# Patient Record
Sex: Male | Born: 1955 | Race: White | Hispanic: No | Marital: Single | Smoking: Current every day smoker
Health system: Southern US, Community
[De-identification: ages and names within clinical notes are randomized; demographics above are authoritative.]

## PROBLEM LIST (undated history)

## (undated) DIAGNOSIS — M79671 Pain in right foot: Secondary | ICD-10-CM

## (undated) DIAGNOSIS — Z9181 History of falling: Secondary | ICD-10-CM

## (undated) DIAGNOSIS — I639 Cerebral infarction, unspecified: Secondary | ICD-10-CM

## (undated) DIAGNOSIS — M545 Low back pain, unspecified: Secondary | ICD-10-CM

## (undated) DIAGNOSIS — I10 Essential (primary) hypertension: Secondary | ICD-10-CM

## (undated) DIAGNOSIS — M1990 Unspecified osteoarthritis, unspecified site: Secondary | ICD-10-CM

## (undated) DIAGNOSIS — M79672 Pain in left foot: Secondary | ICD-10-CM

## (undated) HISTORY — DX: History of falling: Z91.81

## (undated) HISTORY — DX: Pain in left foot: M79.672

## (undated) HISTORY — DX: Pain in right foot: M79.671

---

## 2015-09-16 ENCOUNTER — Emergency Department (HOSPITAL_COMMUNITY)
Admission: EM | Admit: 2015-09-16 | Discharge: 2015-09-16 | Disposition: A | Discharge status: Home / Self Care | Payer: Medicaid Other | Attending: Emergency Medicine | Admitting: Emergency Medicine

## 2015-09-16 ENCOUNTER — Emergency Department (HOSPITAL_COMMUNITY): Payer: Medicaid Other

## 2015-09-16 ENCOUNTER — Encounter (HOSPITAL_COMMUNITY): Payer: Self-pay | Admitting: Emergency Medicine

## 2015-09-16 DIAGNOSIS — M1990 Unspecified osteoarthritis, unspecified site: Secondary | ICD-10-CM | POA: Insufficient documentation

## 2015-09-16 DIAGNOSIS — Y9389 Activity, other specified: Secondary | ICD-10-CM | POA: Insufficient documentation

## 2015-09-16 DIAGNOSIS — S80212A Abrasion, left knee, initial encounter: Secondary | ICD-10-CM | POA: Insufficient documentation

## 2015-09-16 DIAGNOSIS — T07XXXA Unspecified multiple injuries, initial encounter: Secondary | ICD-10-CM

## 2015-09-16 DIAGNOSIS — Y999 Unspecified external cause status: Secondary | ICD-10-CM | POA: Insufficient documentation

## 2015-09-16 DIAGNOSIS — S50312A Abrasion of left elbow, initial encounter: Secondary | ICD-10-CM | POA: Diagnosis not present

## 2015-09-16 DIAGNOSIS — I10 Essential (primary) hypertension: Secondary | ICD-10-CM | POA: Diagnosis not present

## 2015-09-16 DIAGNOSIS — Z79899 Other long term (current) drug therapy: Secondary | ICD-10-CM | POA: Diagnosis not present

## 2015-09-16 DIAGNOSIS — Y92410 Unspecified street and highway as the place of occurrence of the external cause: Secondary | ICD-10-CM | POA: Insufficient documentation

## 2015-09-16 DIAGNOSIS — S59902A Unspecified injury of left elbow, initial encounter: Secondary | ICD-10-CM | POA: Diagnosis present

## 2015-09-16 DIAGNOSIS — M25422 Effusion, left elbow: Secondary | ICD-10-CM | POA: Diagnosis not present

## 2015-09-16 DIAGNOSIS — F17210 Nicotine dependence, cigarettes, uncomplicated: Secondary | ICD-10-CM | POA: Diagnosis not present

## 2015-09-16 DIAGNOSIS — Z8673 Personal history of transient ischemic attack (TIA), and cerebral infarction without residual deficits: Secondary | ICD-10-CM | POA: Insufficient documentation

## 2015-09-16 HISTORY — DX: Unspecified osteoarthritis, unspecified site: M19.90

## 2015-09-16 HISTORY — DX: Low back pain, unspecified: M54.50

## 2015-09-16 HISTORY — DX: Cerebral infarction, unspecified: I63.9

## 2015-09-16 HISTORY — DX: Low back pain: M54.5

## 2015-09-16 HISTORY — DX: Essential (primary) hypertension: I10

## 2015-09-16 LAB — CBG MONITORING, ED: Glucose-Capillary: 99 mg/dL (ref 65–99)
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
## 2015-09-16 MED ORDER — HYDROCODONE-ACETAMINOPHEN 5-325 MG PO TABS: 2.0000 | ORAL_TABLET | ORAL | Status: DC | PRN

## 2015-09-16 MED ORDER — IBUPROFEN 800 MG PO TABS
800.0000 mg | ORAL_TABLET | Freq: Once | ORAL | Status: AC
  Administered 2015-09-16: 800 mg via ORAL
  Filled 2015-09-16: qty 1
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
## 2015-09-16 NOTE — ED Notes (Signed)
MD at bedside. 

## 2015-09-16 NOTE — Discharge Instructions (Signed)
Call orthopedic doctor in 7-10 days with any persistent elbow pain.  Call to the [facility] for appointment for all of your continued ongoing health needs
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
## 2015-12-06 ENCOUNTER — Ambulatory Visit (INDEPENDENT_AMBULATORY_CARE_PROVIDER_SITE_OTHER): Payer: Medicaid Other | Admitting: Internal Medicine

## 2015-12-06 ENCOUNTER — Encounter: Payer: Self-pay | Admitting: Internal Medicine

## 2015-12-06 VITALS — BP 130/68 | HR 62 | Temp 97.9°F | Ht 76.0 in | Wt 217.4 lb

## 2015-12-06 DIAGNOSIS — M79671 Pain in right foot: Secondary | ICD-10-CM

## 2015-12-06 DIAGNOSIS — G8929 Other chronic pain: Secondary | ICD-10-CM

## 2015-12-06 DIAGNOSIS — M79672 Pain in left foot: Secondary | ICD-10-CM

## 2015-12-06 DIAGNOSIS — Z8679 Personal history of other diseases of the circulatory system: Secondary | ICD-10-CM | POA: Diagnosis not present

## 2015-12-06 DIAGNOSIS — M545 Low back pain: Secondary | ICD-10-CM | POA: Diagnosis not present

## 2015-12-06 DIAGNOSIS — R7303 Prediabetes: Secondary | ICD-10-CM | POA: Insufficient documentation

## 2015-12-06 DIAGNOSIS — E785 Hyperlipidemia, unspecified: Secondary | ICD-10-CM

## 2015-12-06 DIAGNOSIS — E119 Type 2 diabetes mellitus without complications: Secondary | ICD-10-CM

## 2015-12-06 HISTORY — DX: Pain in right foot: M79.671

## 2015-12-06 LAB — BASIC METABOLIC PANEL WITH GFR
BUN: 13 mg/dL (ref 7–25)
CHLORIDE: 111 mmol/L — AB (ref 98–110)
CO2: 22 mmol/L (ref 20–31)
Calcium: 9.3 mg/dL (ref 8.6–10.3)
Creat: 0.77 mg/dL (ref 0.70–1.33)
GFR, Est African American: >89 mL/min (ref >=60)
Glucose, Bld: 71 mg/dL (ref 65–99)
POTASSIUM: 4.7 mmol/L (ref 3.5–5.3)
SODIUM: 139 mmol/L (ref 135–146)

## 2015-12-06 LAB — POCT GLYCOSYLATED HEMOGLOBIN (HGB A1C): Hemoglobin A1C: 6

## 2015-12-06 LAB — LIPID PANEL
CHOLESTEROL: 171 mg/dL (ref 125–200)
HDL: 36 mg/dL — ABNORMAL LOW (ref >=40)
LDL Cholesterol: 117 mg/dL (ref <130)
TRIGLYCERIDES: 90 mg/dL (ref <150)
Total CHOL/HDL Ratio: 4.8 Ratio (ref <=5.0)
VLDL: 18 mg/dL (ref <30)

## 2015-12-06 MED ORDER — MELOXICAM 7.5 MG PO TABS: 7.5000 mg | ORAL_TABLET | Freq: Every day | ORAL | 2 refills | Status: DC

## 2015-12-06 NOTE — Patient Instructions (Signed)
It was nice meeting you today [name].   We will call you if your bloodwork is abnormal and we need to begin any medications.   For your back pain, you can take Mobic (meloxicam) every morning. It is also important to do the strengthening and stretching exercises provided in the handout at least twice a day. Staying active will also help with your pain.   For your foot pain, please continue to wear comfortable shoes with good support. Mobic should also help with your foot pain. If your pain continues to worsen, we can discuss referral to a podiatrist.   If you have any questions or concerns, please feel free to call the clinic.   Be well,  Dr. Natale Mil[place]   Back Pain, Adult Back pain is very common. The pain often gets better over time. The cause of back pain is usually not dangerous. Most people can learn to manage their back pain on their own.  HOME CARE  Watch your back pain for any changes. The following actions may help to lessen any pain you are feeling:  Stay active. Start with short walks on flat ground if you can. Try to walk farther each day.  Exercise regularly as told by your doctor. Exercise helps your back heal faster. It also helps avoid future injury by keeping your muscles strong and flexible.  Do not sit, drive, or stand in one place for more than 30 minutes.  Do not stay in bed. Resting more than 1-2 days can slow down your recovery.  Be careful when you bend or lift an object. Use good form when lifting:  Bend at your knees.  Keep the object close to your body.  Do not twist.  Sleep on a firm mattress. Lie on your side, and bend your knees. If you lie on your back, put a pillow under your knees.  Take medicines only as told by your doctor.  Put ice on the injured area.  Put ice in a plastic bag.  Place a towel between your skin and the bag.  Leave the ice on for 20 minutes, 2-3 times a day for the first 2-3 days. After that, you can switch between ice  and heat packs.  Avoid feeling anxious or stressed. Find good ways to deal with stress, such as exercise.  Maintain a healthy weight. Extra weight puts stress on your back. GET HELP IF:   You have pain that does not go away with rest or medicine.  You have worsening pain that goes down into your legs or buttocks.  You have pain that does not get better in one week.  You have pain at night.  You lose weight.  You have a fever or chills. GET HELP RIGHT AWAY IF:   You cannot control when you poop (bowel movement) or pee (urinate).  Your arms or legs feel weak.  Your arms or legs lose feeling (numbness).  You feel sick to your stomach (nauseous) or throw up (vomit).  You have belly (abdominal) pain.  You feel like you may pass out (faint).   This information is not intended to replace advice given to you by your health care provider. Make sure you discuss any questions you have with your health care provider.   Document Released: 10/03/2007 Document Revised: 05/07/2014 Document Reviewed: 08/18/2013 Elsevier Interactive Patient Education 2016 Else[employer] Inc[employer]   Back Exercises If you have pain in your back, do these exercises 2-3 times each day or as told  by your doctor. When the pain goes away, do the exercises once each day, but repeat the steps more times for each exercise (do more repetitions). If you do not have pain in your back, do these exercises once each day or as told by your doctor. EXERCISES Single Knee to Chest Do these steps 3-5 times in a row for each leg: 1. Lie on your back on a firm bed or the floor with your legs stretched out. 2. Bring one knee to your chest. 3. Hold your knee to your chest by grabbing your knee or thigh. 4. Pull on your knee until you feel a gentle stretch in your lower back. 5. Keep doing the stretch for 10-30 seconds. 6. Slowly let go of your leg and straighten it. Pelvic Tilt Do these steps 5-10 times in a row: 1. Lie on your back  on a firm bed or the floor with your legs stretched out. 2. Bend your knees so they point up to the ceiling. Your feet should be flat on the floor. 3. Tighten your lower belly (abdomen) muscles to press your lower back against the floor. This will make your tailbone point up to the ceiling instead of pointing down to your feet or the floor. 4. Stay in this position for 5-10 seconds while you gently tighten your muscles and breathe evenly. Cat-Cow Do these steps until your lower back bends more easily: 1. Get on your hands and knees on a firm surface. Keep your hands under your shoulders, and keep your knees under your hips. You may put padding under your knees. 2. Let your head hang down, and make your tailbone point down to the floor so your lower back is round like the back of a cat. 3. Stay in this position for 5 seconds. 4. Slowly lift your head and make your tailbone point up to the ceiling so your back hangs low (sags) like the back of a cow. 5. Stay in this position for 5 seconds. Press-Ups Do these steps 5-10 times in a row: 1. Lie on your belly (face-down) on the floor. 2. Place your hands near your head, about shoulder-width apart. 3. While you keep your back relaxed and keep your hips on the floor, slowly straighten your arms to raise the top half of your body and lift your shoulders. Do not use your back muscles. To make yourself more comfortable, you may change where you place your hands. 4. Stay in this position for 5 seconds. 5. Slowly return to lying flat on the floor. Bridges Do these steps 10 times in a row: 1. Lie on your back on a firm surface. 2. Bend your knees so they point up to the ceiling. Your feet should be flat on the floor. 3. Tighten your butt muscles and lift your butt off of the floor until your waist is almost as high as your knees. If you do not feel the muscles working in your butt and the back of your thighs, slide your feet 1-2 inches farther away from your  butt. 4. Stay in this position for 3-5 seconds. 5. Slowly lower your butt to the floor, and let your butt muscles relax. If this exercise is too easy, try doing it with your arms crossed over your chest. Belly Crunches Do these steps 5-10 times in a row: 1. Lie on your back on a firm bed or the floor with your legs stretched out. 2. Bend your knees so they point up to the  ceiling. Your feet should be flat on the floor. 3. Cross your arms over your chest. 4. Tip your chin a little bit toward your chest but do not bend your neck. 5. Tighten your belly muscles and slowly raise your chest just enough to lift your shoulder blades a tiny bit off of the floor. 6. Slowly lower your chest and your head to the floor. Back Lifts Do these steps 5-10 times in a row: 1. Lie on your belly (face-down) with your arms at your sides, and rest your forehead on the floor. 2. Tighten the muscles in your legs and your butt. 3. Slowly lift your chest off of the floor while you keep your hips on the floor. Keep the back of your head in line with the curve in your back. Look at the floor while you do this. 4. Stay in this position for 3-5 seconds. 5. Slowly lower your chest and your face to the floor. GET HELP IF:  Your back pain gets a lot worse when you do an exercise.  Your back pain does not lessen 2 hours after you exercise. If you have any of these problems, stop doing the exercises. Do not do them again unless your doctor says it is okay. GET HELP RIGHT AWAY IF:  You have sudden, very bad back pain. If this happens, stop doing the exercises. Do not do them again unless your doctor says it is okay.   This information is not intended to replace advice given to you by your health care provider. Make sure you discuss any questions you have with your health care provider.   Document Released: 05/19/2010 Document Revised: 01/05/2015 Document Reviewed: 06/10/2014 Elsevier Interactive Patient Education [employer].

## 2015-12-06 NOTE — Assessment & Plan Note (Signed)
Reportedly diagnosed at prior PCP's office. Not currently taking medications, though says he was in the past but cannot remember which ones. BP 130/68 today.  - Given current BP, will not begin medication at this time.  - Continue to monitor

## 2015-12-06 NOTE — Progress Notes (Signed)
60 y.o. year old male presents to establish care.  Acute Concerns:  Heel pain Pain in both heels intermittently since March. Reports that it feels as if he is walking on stones. Also endorses sharp pain on the lateral portion of feet. Has tried insoles with no relief. Does have improvement of symptom with memory foam tennis shoes. Pain is worse at end of day. Has taken ibuprofen for pain with minimal improvement. Does have difficulty walking at times due to pain. Denies numbness, weakness, tingling.   Lower back pain For the past 15 years. Formerly worked at a job that required frequent heavy lifting. Has never had back surgery, and does not desire this. Worse first thing in AM and late in the evening. Reports he has been told that he has arthritis and has 5 discs out of line. Endorses midline pain that shoots up his back into his shoulder and neck. Ibuprofen does not provide relief. Was reportedly taking the highest dose of Tylenol with codeine in [place] which did provide relief. Denies bowel or bladder incontinence, saddle anesthesia.   Social: Moved from [place] to [place] five months ago. Has not established care with a provider until today. Was formerly married until his wife passed away two years ago. Reports history of hyperlipidemia, Type II DM, "minor strokes," HTN, MI.   Exercise: Walks daily  Sexual History: Not sexually active for past two years after his wife died.   Social:  Social History   Social History  . Marital status: Single    Spouse name: N/A  . Number of children: N/A  . Years of education: N/A   Social History Main Topics  . Smoking status: Current Every Day Smoker    Packs/day: 1.00    Years: 41.00    Types: Cigarettes  . Smokeless tobacco: Never Used  . Alcohol use No  . Drug use:     Types: Marijuana     Comment: About once monthly when in severe pain or needs to relax  . Sexual activity: Not Currently   Other Topics Concern  . None    Social History Narrative  . None    Physical Exam: VITALS: Reviewed GEN: Pleasant male, NAD HEENT: Normocephalic, PERRL, EOMI, no scleral icterus, bilateral TM pearly grey, nasal septum midline, MMM, uvula midline, no anterior or posterior lymphadenopathy, no thyromegaly; edentulous  CARDIAC:RRR, S1 and S2 present, no murmur, no heaves/thrills RESP: CTAB, normal effort ABD: soft, no tenderness, normal bowel sounds EXT: No edema, no tenderness; normal gait  ASSESSMENT & PLAN: 60 y.o. male presents for annual well male/preventative exam. Please see problem specific assessment and plan.   Hyperlipidemia Not currently on medication and cannot remember what he has taken in the past.  - Lipid panel today - Will call patient if there are abnormalities and indication to resume medication  History of hypertension Reportedly diagnosed at prior PCP's office. Not currently taking medications, though says he was in the past but cannot remember which ones. BP 130/68 today.  - Given current BP, will not begin medication at this time.  - Continue to monitor  Type 2 diabetes mellitus (HCC) Diagnosed by former PCP. Was taking metformin 1000mg  qd, but has not taken for past two months. Does not check blood sugar at home.  - A1C today - Will call patient with results if need to resume medication  Chronic lower back pain Currently taking ibuprofen. Not interfering with daily activities. No red flags.  - Meloxicam 7.5mg  qd. Can increase  to  if ineffective - Provided handouts with strengthening and stretching exercises and instructions to perform exercises at least twice a day - F/u at next appointment   Heel pain, bilateral Unknown etiology. Not interfering with daily activities and improved when wearing memory foam tennis shoes.  - Meloxicam 7.5mg  qd - Continue to wear memory foam shoes or orthotic insoles if provides pain relief - Can consider podiatry referral if no improvement

## 2015-12-06 NOTE — Assessment & Plan Note (Signed)
Unknown etiology. Not interfering with daily activities and improved when wearing memory foam tennis shoes.  - Meloxicam 7.5mg  qd - Continue to wear memory foam shoes or orthotic insoles if provides pain relief - Can consider podiatry referral if no improvement
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
## 2015-12-06 NOTE — Assessment & Plan Note (Signed)
Not currently on medication and cannot remember what he has taken in the past.  - Lipid panel today - Will call patient if there are abnormalities and indication to resume medication

## 2015-12-06 NOTE — Assessment & Plan Note (Signed)
Currently taking ibuprofen. Not interfering with daily activities. No red flags.  - Meloxicam 7.5mg  qd. Can increase to  if ineffective - Provided handouts with strengthening and stretching exercises and instructions to perform exercises at least twice a day - F/u at next appointment

## 2015-12-12 ENCOUNTER — Telehealth: Payer: Self-pay | Admitting: Internal Medicine

## 2015-12-12 NOTE — Telephone Encounter (Signed)
Pt is calling for his lab results. jw  °

## 2015-12-14 NOTE — Telephone Encounter (Signed)
Patient informed. 

## 2016-06-13 ENCOUNTER — Ambulatory Visit: Payer: Medicaid Other | Admitting: Family Medicine

## 2016-08-06 ENCOUNTER — Inpatient Hospital Stay
Admission: AD | Admit: 2016-08-06 | Payer: No Typology Code available for payment source | Source: Intra-hospital | Admitting: Psychiatry

## 2017-09-04 ENCOUNTER — Ambulatory Visit: Payer: Self-pay

## 2017-11-27 ENCOUNTER — Ambulatory Visit: Payer: Medicaid Other

## 2017-11-29 ENCOUNTER — Ambulatory Visit: Payer: Medicaid Other | Admitting: Family Medicine

## 2017-11-29 ENCOUNTER — Encounter: Payer: Self-pay | Admitting: Family Medicine

## 2017-11-29 ENCOUNTER — Other Ambulatory Visit: Payer: Self-pay

## 2017-11-29 VITALS — BP 124/64 | HR 64 | Temp 98.7°F | Ht 76.0 in | Wt 217.8 lb

## 2017-11-29 DIAGNOSIS — G8929 Other chronic pain: Secondary | ICD-10-CM

## 2017-11-29 DIAGNOSIS — R7303 Prediabetes: Secondary | ICD-10-CM

## 2017-11-29 DIAGNOSIS — M545 Low back pain: Secondary | ICD-10-CM

## 2017-11-29 DIAGNOSIS — E119 Type 2 diabetes mellitus without complications: Secondary | ICD-10-CM

## 2017-11-29 DIAGNOSIS — Z0000 Encounter for general adult medical examination without abnormal findings: Secondary | ICD-10-CM

## 2017-11-29 LAB — POCT GLYCOSYLATED HEMOGLOBIN (HGB A1C): HbA1c, POC (controlled diabetic range): 5.5 % (ref 0.0–7.0)

## 2017-11-29 MED ORDER — NITROGLYCERIN 0.4 MG SL SUBL: 0.4000 mg | SUBLINGUAL_TABLET | SUBLINGUAL | 3 refills | Status: DC | PRN

## 2017-11-29 MED ORDER — MELOXICAM 7.5 MG PO TABS: 7.5000 mg | ORAL_TABLET | Freq: Every day | ORAL | 2 refills | Status: DC

## 2017-11-29 MED ORDER — DICLOFENAC SODIUM 1 % TD GEL: 4.0000 g | Freq: Four times a day (QID) | TRANSDERMAL | 0 refills | Status: DC

## 2017-11-29 NOTE — Patient Instructions (Addendum)
It was great seeing you today! Congrats, your A1C has improved today. It looks like you are mostly up to date on your health maintenance items. I will work on getting you an Tree surgeonelectric chair. I have sent in prescriptions to your pharmacy for nitro, voltaren gel, and meloxicam. I have put in an order for the cologuard. I will also work on getting you an Art gallery managerelectric scooter.

## 2017-12-05 DIAGNOSIS — Z0000 Encounter for general adult medical examination without abnormal findings: Secondary | ICD-10-CM | POA: Insufficient documentation

## 2017-12-05 NOTE — Assessment & Plan Note (Signed)
Patient interested in colonscopy. He [place] and [place] [facility] [place]

## 2017-12-05 NOTE — Assessment & Plan Note (Signed)
a1c 5.5 placing him pre-diabetes. Doing well with no issues on this standpoint. Refused all diabetes screening measures as he "doesn't have diabetes"

## 2017-12-05 NOTE — Assessment & Plan Note (Signed)
Refilled meloxicam. Started voltaren gel. Has been stable for years. Interested in the voltaren.

## 2017-12-05 NOTE — Progress Notes (Signed)
   HPI 62 year old who presents for health check up. Patient's a1c 5.5 from 6.0. He is interested in an electric chair to scoot around in. Patient without significant gait abnormality.   Discussed health maintenance items such as colonoscopy. Patient claims to have gotten hep c, pneumovax, hiv, [place]ap in [place]. Asked him to try and get those records to us.  Patient also asking for refills on nitro and meloxicam.   CC: check up   ROS:   Review of Systems See HPI for ROS.   CC, SH/smoking status, and VS noted  Objective: BP 124/64   Pulse 64   Temp 98.7 F (37.1 C) (Oral)   Ht 6\' 4"  (1.93 m)   Wt 217 lb 12.8 oz (98.8 kg)   SpO2 96%   BMI 26.51 kg/m  Gen: NAD, alert, cooperative, and pleasant. Middle aged caucasian male no acute distress HEENT: NCAT, EOMI, PERRL CV: RRR, no murmur Resp: CTAB, no wheezes, non-labored Abd: SNTND, BS present, no guarding or organomegaly Ext: No edema, warm Neuro: Alert and oriented, Speech clear, No gross deficits   Assessment and plan:  Pre-diabetes a1c 5.5 placing him pre-diabetes. Doing well with no issues on this standpoint. Refused all diabetes screening measures as he "doesn't have diabetes"  Health care maintenance Patient interested in colonscopy. He will try and get records sent to [facility] [place].  Chronic lower back pain Refilled meloxicam. Started voltaren gel. Has been stable for years. Interested in the voltaren.   Orders Placed This Encounter  Procedures  . POCT glycosylated hemoglobin (Hb A1C)    Meds ordered this encounter  Medications  . meloxicam (MOBIC) 7.5 MG tablet    Sig: Take 1 tablet (7.5 mg total) by mouth daily.    Dispense:  30 tablet    Refill:  2  . diclofenac sodium (VOLTAREN) 1 % GEL    Sig: Apply 4 g topically 4 (four) times daily.    Dispense:  100 g    Refill:  0  . nitroGLYCERIN (NITROSTAT) 0.4 MG SL tablet    Sig: Place 1 tablet (0.4 mg total) under the tongue every 5 (five)  minutes as needed for chest pain.    Dispense:  50 tablet    Refill:  3     Myrene BuddyJacob Sevilla Murtagh MD PGY-2 Family Medicine Resident  12/05/2017 6:46 PM

## 2018-01-27 ENCOUNTER — Ambulatory Visit (INDEPENDENT_AMBULATORY_CARE_PROVIDER_SITE_OTHER): Payer: Medicaid Other | Admitting: Family Medicine

## 2018-01-27 ENCOUNTER — Other Ambulatory Visit: Payer: Self-pay

## 2018-01-27 ENCOUNTER — Encounter: Payer: Self-pay | Admitting: Family Medicine

## 2018-01-27 VITALS — BP 128/72 | HR 61 | Temp 97.5°F | Wt 218.0 lb

## 2018-01-27 DIAGNOSIS — Z8679 Personal history of other diseases of the circulatory system: Secondary | ICD-10-CM

## 2018-01-27 DIAGNOSIS — R0602 Shortness of breath: Secondary | ICD-10-CM

## 2018-01-27 DIAGNOSIS — Z23 Encounter for immunization: Secondary | ICD-10-CM | POA: Diagnosis not present

## 2018-01-27 DIAGNOSIS — G8929 Other chronic pain: Secondary | ICD-10-CM | POA: Diagnosis not present

## 2018-01-27 DIAGNOSIS — M545 Low back pain: Secondary | ICD-10-CM

## 2018-01-27 DIAGNOSIS — Z0000 Encounter for general adult medical examination without abnormal findings: Secondary | ICD-10-CM

## 2018-01-27 DIAGNOSIS — R0609 Other forms of dyspnea: Secondary | ICD-10-CM

## 2018-01-27 MED ORDER — ALBUTEROL SULFATE HFA 108 (90 BASE) MCG/ACT IN AERS: 2.0000 | INHALATION_SPRAY | Freq: Four times a day (QID) | RESPIRATORY_TRACT | 2 refills | Status: AC | PRN

## 2018-01-27 NOTE — Assessment & Plan Note (Signed)
BP wnl today at 128/72, continue to monitor.

## 2018-01-27 NOTE — Assessment & Plan Note (Addendum)
Reports chronic history of dyspnea on exertion after ~0.75-1 mile of walking, however can walk several miles a day without stopping despite this SOB. No increased WOB with clear lungs during evaluation, satting well on RA. Consideration for COPD given patient has a 44 pack year smoking history with DOE, cough, and sputum production on occasion that has been alleviated with albuterol in the past. However also question concurrent underlying CAD with stable anginal equivalent, with questionable history of MI (not in problem list, but patient reports being told "a part of his heart doesn't move"), but reassured no report of chest pain and can walk quite far before perceived SOB. Unlikely deconditioning, as patient is quite active.   -Prescribed albuterol inhaler  -Schedule appt with pharmacy clinic for PFTs  -Discuss starting statin on follow up (ASCVD 10 year risk 14%)  -Not interesting in smoking cessation  -Would like to discuss this further on follow up visit with likely additional workup, return precautions have been discussed for earlier than scheduled

## 2018-01-27 NOTE — Progress Notes (Signed)
Subjective:    Patient ID: Javier Avila, male    DOB: 02/03/1956, 62 y.o.   MRN: [id number]   HPI: [name] is a 62 year old male presenting for further follow up of his electric chair and home colon screening test and also wanting to discuss the following:  Electric wheelchair: Discussed with Dr. Primitivo Gauze in August as well. States he previously had one in [place], however it would not fit on the train when he left. Used it for when his back started hurting or legs get tired. Can currently walking without concern for several miles. After a long day, his legs will start to feel tired, but denies any falls or weakness.   Shortness of breath with exertion: Reports being present for "quite some time". Pt walks several miles a day, however reports feeling more winded after 0.5-1 mile of walking. He can continue to walk past this point, does not need to stop. He does note some occasional coughing with mucus production sometimes. Denies any SOB at rest or associated chest pain. 44 pack year history. He previously had an albuterol inhaler, which helped. Believes he may have been evaluated for COPD before in [place], but not sure. Also reports he previously had an episode of chest pain, was told it was an anxiety attack, but later informed by someone that "part of his heart doesn't move." He is a requesting an inhaler today.  Colon screening: Never heard further about setting up colon screening from previous appointment. He does not want to do a colonoscopy, would rather have something at home instead. Discussed the benefits and risks of colonoscopy vs home screening, wishes to proceed with cologuard.   He is also requesting some ensure. He is only eating one meal a day because he is trying to lose weight.    Smoking status reviewed- not interested in quitting.   Review of Systems Per HPI, also denies recent illness, fever, headache, changes in vision, chest pain, abdominal pain, N/V/D,  weakness   Patient Active Problem List   Diagnosis Date Noted  . Dyspnea on exertion 01/27/2018  . Health care maintenance 12/05/2017  . Hyperlipidemia 12/06/2015  . History of hypertension 12/06/2015  . Pre-diabetes 12/06/2015  . Chronic lower back pain 12/06/2015  . Heel pain, bilateral 12/06/2015     Objective:  BP 128/72 (BP Location: Right Arm)   Pulse 61   Temp (!) 97.5 F (36.4 C) (Oral)   Wt 218 lb (98.9 kg)   BMI 26.54 kg/m  Vitals and nursing note reviewed  General: NAD, pleasant Cardiac: RRR, faint heart sounds, no murmurs Respiratory: CTAB, appreciated prolonged expiratory phase, normal effort Abdomen: soft, nontender, nondistended  Extremities: no edema or cyanosis. WWP. 5/5 BUE and BLE strength, normal gait  Skin: warm and dry, no rashes noted Neuro: alert and oriented, no focal deficits Psych: normal affect  Assessment & Plan:    Health care maintenance -Ordered Cologuard, patient to receive in mail.  -Received Flu shot today -Patient to try to receive records of previous screening and vaccinations (TDAP, HIV, etc), if not able to, consideration for providing these on next visit  -Discuss low dose CT scan for lung cancer screening in future appointments   Dyspnea on exertion Reports chronic history of dyspnea on exertion after ~0.75-1 mile of walking, however can walk several miles a day without stopping despite this SOB. No increased WOB with clear lungs during evaluation, satting well on RA. Consideration for COPD given patient has a 44  pack year smoking history with DOE, cough, and sputum production on occasion that has been alleviated with albuterol in the past. However also question concurrent underlying CAD with stable anginal equivalent, with questionable history of MI (not in problem list, but patient reports being told "a part of his heart doesn't move"), but reassured no report of chest pain and can walk quite far before perceived SOB. Unlikely  deconditioning, as patient is quite active.   -Prescribed albuterol inhaler  -Schedule appt with pharmacy clinic for PFTs  -Discuss starting statin on follow up (ASCVD 10 year risk 14%)  -Not interesting in smoking cessation  -Would like to discuss this further on follow up visit with likely additional workup, return precautions have been discussed for earlier than scheduled   History of hypertension BP wnl today at 128/72, continue to monitor.   Chronic lower back pain Pt requesting an electric wheelchair for when his legs get tired/back starts hurting after several miles of walking. Normal gait in the office with full strength. Discussed at length an electric wheelchair is not appropriate at this time given no significant motor disability. He shows understanding and will continue to use his cane when needed.   Also provided coupons for Ensure, however discussed appropriate nutrition/physical activity for weight loss. Received flu shot today.   F/u in pharm clinic for PFTs, also f/u in 1 month or sooner to further discuss SOB, statin therapy, and additional concerns (?tremor).   Leticia Penna, DO Family Medicine Resident PGY-1

## 2018-01-27 NOTE — Assessment & Plan Note (Addendum)
-  Ordered Cologuard, patient to receive in mail.  -Received Flu shot today -Patient to try to receive records of previous screening and vaccinations (TDAP, HIV, etc), if not able to, consideration for providing these on next visit  -Discuss low dose CT scan for lung cancer screening in future appointments

## 2018-01-27 NOTE — Patient Instructions (Addendum)
It was so nice meet you! I have ordered cologuard screening for you, should come to your home. We will start you on an albuterol inhaler for your SOB and make sure you schedule an appointment for lung function testing with the pharmacy clinic. I have also provided you with ensure coupons, try to balance your diet with more meals throughout the day and plenty of veggies.   F/u in 1 month or sooner.

## 2018-01-27 NOTE — Assessment & Plan Note (Signed)
Pt requesting an electric wheelchair for when his legs get tired/back starts hurting after several miles of walking. Normal gait in the office with full strength. Discussed at length an electric wheelchair is not appropriate at this time given no significant motor disability. He shows understanding and will continue to use his cane when needed.

## 2018-02-04 DIAGNOSIS — Z1211 Encounter for screening for malignant neoplasm of colon: Secondary | ICD-10-CM | POA: Diagnosis not present

## 2018-02-04 DIAGNOSIS — Z1212 Encounter for screening for malignant neoplasm of rectum: Secondary | ICD-10-CM | POA: Diagnosis not present

## 2018-02-04 LAB — COLOGUARD: Cologuard: NEGATIVE

## 2018-02-25 ENCOUNTER — Telehealth: Payer: Self-pay | Admitting: Family Medicine

## 2018-02-25 NOTE — Telephone Encounter (Signed)
Spoke with pt and reminded them of/confirmed their appt on 02/26/18. -CH

## 2018-02-26 ENCOUNTER — Ambulatory Visit: Payer: Medicaid Other | Admitting: Family Medicine

## 2018-02-26 ENCOUNTER — Encounter: Payer: Self-pay | Admitting: Family Medicine

## 2018-02-26 ENCOUNTER — Other Ambulatory Visit: Payer: Self-pay

## 2018-02-26 DIAGNOSIS — G8929 Other chronic pain: Secondary | ICD-10-CM | POA: Diagnosis not present

## 2018-02-26 DIAGNOSIS — Z0000 Encounter for general adult medical examination without abnormal findings: Secondary | ICD-10-CM | POA: Diagnosis not present

## 2018-02-26 DIAGNOSIS — Z9181 History of falling: Secondary | ICD-10-CM

## 2018-02-26 DIAGNOSIS — M545 Low back pain: Secondary | ICD-10-CM | POA: Diagnosis not present

## 2018-02-26 DIAGNOSIS — R0609 Other forms of dyspnea: Secondary | ICD-10-CM

## 2018-02-26 NOTE — Progress Notes (Signed)
Subjective:    Patient ID: Javier Avila, male    DOB: 1955/10/08, 62 y.o.   MRN: [id number]   CC: F/u   HPI: Mr Blansett is a 62 year old gentleman presenting to discuss the following:   DOE: Was not able to get PFT's completed yet. Prescribed albuterol last visit, states this has helped his SOB after walking for long periods of time. Still walking 5-10 miles over the course of each day.   Chronic back pain: several years history of lower back pain. States previously in [place] he was told he had five bulging discs. No further progression since onset. Only hurts when he hurts bends forward, not impacting his daily life activities. However, he does note that occasionally his legs will feel like they're "giving out". He reports previously falls with his legs feeling weak, but has not had a fall in several months. Denies any numbness, paresthesias, bowel/bladder incontinence, saddle anesthesia. He is not interested in ever having surgery on his back, may consider sport's medicine or pain clinic evaluation in the future. Continues to request electric wheelchair for when he gets tired.  Patient is a poor historian with varying and inconsistent history on repeat discussions of his concerns.     Smoking status reviewed- cutting back on his own, reports 1/2 PPD today.   Review of Systems Per HPI, also denies recent illness, fever, headache, changes in vision, chest pain , abdominal pain, N/V/D   Patient Active Problem List   Diagnosis Date Noted  . Falls infrequently 02/28/2018  . Dyspnea on exertion 01/27/2018  . Health care maintenance 12/05/2017  . Hyperlipidemia 12/06/2015  . History of hypertension 12/06/2015  . Pre-diabetes 12/06/2015  . Chronic lower back pain 12/06/2015  . Heel pain, bilateral 12/06/2015     Objective:  BP 128/68   Pulse 70   Temp 98 F (36.7 C) (Oral)   Ht 6\' 4"  (1.93 m)   Wt 100.2 kg   SpO2 95%   BMI 26.88 kg/m  Vitals and nursing note  reviewed  General: NAD, pleasant Cardiac: RRR, normal heart sounds, no murmurs Respiratory: CTAB, normal effort Abdomen: soft, nontender, nondistended Extremities: no edema or cyanosis. WWP. MSK/neuro: Alert and oriented, no focal neuro deficits. 5/5 bilateral upper and lower extremity strength. Sensation to light touch intact. 2/4 patellar and bicep reflexes bilaterally. No tenderness to palpation of lumbar/thoracic spinous processes. No red reflex noted, back musculature symmetrical.  Skin: warm and dry, no rashes noted. No bruised noted on extremities.  Psych: normal affect  Assessment & Plan:    Health care maintenance Not able to obtain previous records from [place]. Will obtain HIV, Hep C, CMP, and lipid panel today with consideration of starting statin at next visit.   Chronic lower back pain Declines surgical evaluation. Patient may consider further evaluation in the future if this further impairs his functional ability. Continue to discuss that an electric wheelchair is not appropriate at this time given his stable gait and full lower extremity strength on exam.  -Discussed continued physical activity with walking, and taking breaks when legs start feeling tired.  -Consider obtaining imaging in future visits   Falls infrequently Patient reports inconsistent history of falls from every other day to "several months" since his last fall due to his legs "giving out". Difficult to assess how often he is actually falling or seeking behavior for his electric wheelchair. Normal gait and strength in the office, able to walk 5-10 miles on the daily at home.  Will continue to monitor this closely.  -Discussed breaks when he is feeling tired ] -Fall precautions at home   Dyspnea on exertion Improving with albuterol. More likely lung etiology (?COPD) rather than cardiac origin given duration of symptoms and onset not for a few miles after walking. Has cut down on his smoking, now to 1/2  PPD. Will plan to have PFT's scheduled for this month and follow up afterwards. -Schedule PFT's -Obtain CBC, ensure no underlying anemia contributing  -Cont albuterol PRN    F/u in 1 month after PFTs   Leticia Penna, DO Family Medicine Resident PGY-1

## 2018-02-26 NOTE — Patient Instructions (Signed)
So nice to see you again! Please schedule for pulmonary function testing and continue cutting back on smoking, you are doing wonderfully! I will let you know the results of your labwork in the next 1-2 weeks and we will discuss them on your follow up visit.

## 2018-02-27 LAB — CBC
HEMATOCRIT: 50.4 % (ref 37.5–51.0)
HEMOGLOBIN: 17.1 g/dL (ref 13.0–17.7)
MCH: 31.1 pg (ref 26.6–33.0)
MCHC: 33.9 g/dL (ref 31.5–35.7)
MCV: 92 fL (ref 79–97)
Platelets: 256 10*3/uL (ref 150–450)
RBC: 5.49 x10E6/uL (ref 4.14–5.80)
RDW: 13 % (ref 12.3–15.4)
WBC: 9.6 10*3/uL (ref 3.4–10.8)

## 2018-02-27 LAB — LIPID PANEL
CHOL/HDL RATIO: 4.6 ratio (ref 0.0–5.0)
Cholesterol, Total: 165 mg/dL (ref 100–199)
HDL: 36 mg/dL — AB (ref >39)
LDL Calculated: 109 mg/dL — ABNORMAL HIGH (ref 0–99)
TRIGLYCERIDES: 100 mg/dL (ref 0–149)
VLDL CHOLESTEROL CAL: 20 mg/dL (ref 5–40)

## 2018-02-27 LAB — COMPREHENSIVE METABOLIC PANEL
ALBUMIN: 4.4 g/dL (ref 3.6–4.8)
ALK PHOS: 73 IU/L (ref 39–117)
ALT: 31 IU/L (ref 0–44)
AST: 28 IU/L (ref 0–40)
Albumin/Globulin Ratio: 1.8 (ref 1.2–2.2)
BUN / CREAT RATIO: 13 (ref 10–24)
BUN: 12 mg/dL (ref 8–27)
Bilirubin Total: 0.2 mg/dL (ref 0.0–1.2)
CHLORIDE: 107 mmol/L — AB (ref 96–106)
CO2: 24 mmol/L (ref 20–29)
CREATININE: 0.94 mg/dL (ref 0.76–1.27)
Calcium: 9.9 mg/dL (ref 8.6–10.2)
GFR calc non Af Amer: 88 mL/min/{1.73_m2} (ref >59)
GFR, EST AFRICAN AMERICAN: 101 mL/min/{1.73_m2} (ref >59)
GLOBULIN, TOTAL: 2.5 g/dL (ref 1.5–4.5)
Glucose: 87 mg/dL (ref 65–99)
Potassium: 4.6 mmol/L (ref 3.5–5.2)
SODIUM: 145 mmol/L — AB (ref 134–144)
Total Protein: 6.9 g/dL (ref 6.0–8.5)

## 2018-02-27 LAB — HIV ANTIBODY (ROUTINE TESTING W REFLEX): HIV Screen 4th Generation wRfx: NONREACTIVE

## 2018-02-27 LAB — HEPATITIS C ANTIBODY: Hep C Virus Ab: <0.1 s/co ratio (ref 0.0–0.9)

## 2018-02-28 DIAGNOSIS — Z9181 History of falling: Secondary | ICD-10-CM

## 2018-02-28 HISTORY — DX: History of falling: Z91.81

## 2018-02-28 NOTE — Assessment & Plan Note (Signed)
Declines surgical evaluation. Patient may consider further evaluation in the future if this further impairs his functional ability. Continue to discuss that an electric wheelchair is not appropriate at this time given his stable gait and full lower extremity strength on exam.  -Discussed continued physical activity with walking, and taking breaks when legs start feeling tired.  -Consider obtaining imaging in future visits

## 2018-02-28 NOTE — Assessment & Plan Note (Addendum)
Not able to obtain previous records from [place]. Will obtain HIV, Hep C, CMP, and lipid panel today with consideration of starting statin at next visit.

## 2018-02-28 NOTE — Assessment & Plan Note (Signed)
Patient reports inconsistent history of falls from every other day to "several months" since his last fall due to his legs "giving out". Difficult to assess how often he is actually falling or seeking behavior for his electric wheelchair. Normal gait and strength in the office, able to walk 5-10 miles on the daily at home. Will continue to monitor this closely.  -Discussed breaks when he is feeling tired ] -Fall precautions at home

## 2018-02-28 NOTE — Assessment & Plan Note (Signed)
Improving with albuterol. More likely lung etiology (?COPD) rather than cardiac origin given duration of symptoms and onset not for a few miles after walking. Has cut down on his smoking, now to 1/2 PPD. Will plan to have PFT's scheduled for this month and follow up afterwards. -Schedule PFT's -Obtain CBC, ensure no underlying anemia contributing  -Cont albuterol PRN

## 2018-03-03 ENCOUNTER — Encounter: Payer: Self-pay | Admitting: Family Medicine

## 2018-03-17 ENCOUNTER — Ambulatory Visit: Payer: Self-pay | Admitting: Pharmacist

## 2018-03-31 ENCOUNTER — Ambulatory Visit (INDEPENDENT_AMBULATORY_CARE_PROVIDER_SITE_OTHER): Payer: Medicaid Other | Admitting: Pharmacist

## 2018-03-31 ENCOUNTER — Encounter: Payer: Self-pay | Admitting: Pharmacist

## 2018-03-31 DIAGNOSIS — R0609 Other forms of dyspnea: Secondary | ICD-10-CM | POA: Diagnosis not present

## 2018-03-31 MED ORDER — MOMETASONE FURO-FORMOTEROL FUM 200-5 MCG/ACT IN AERO: 2.0000 | INHALATION_SPRAY | Freq: Two times a day (BID) | RESPIRATORY_TRACT | 5 refills | Status: DC

## 2018-03-31 NOTE — Progress Notes (Signed)
Patient ID: Javier ClossChet Avila, male   DOB: 1955-07-11, 62 y.o.   MRN: [id number] Reviewed: Agree with Dr. Macky LowerKoval's documentation and management.

## 2018-03-31 NOTE — Assessment & Plan Note (Signed)
Patient has been experiencing dyspnea for more than 1 year and taking PRN albuterol approximately daily for symptom relief.  Spirometry evaluation reveals possible restrictive lung disease. Post nebulized albuterol tx revealed improvement of ~ 14 % in FVC.  Not interested in tobacco cessation at this time.  Reviewed proper MDI inhaler technique. -begin Dulera 2 puffs twice daily  -Educated patient on purpose, proper use, potential adverse effects including risk of esophageal candidiasis and need to rinse mouth after each use.   -Reviewed results of pulmonary function tests.  Pt verbalized understanding of results and education.

## 2018-03-31 NOTE — Progress Notes (Signed)
   S:    Patient arrives in good spirits, walking comfortably without assistance - appeared to have some unsteadiness when transitioning from sitting to standing.    Presents for lung function evaluation.   Patient was referred by Dr. Annia FriendlyBeard (referred on 02/26/2018).  Patient was last seen by Primary Care Provider on 02/26/2018.  Patient reports breathing has been problematic for more than 1 year.   Patient reports adherence to medications Patient reports last dose of respiratory medication (PRN albuterol) was > 24 hours prior.  Rescue inhaler use frequency: once daily maximal use. Patient exacerbation hx: none reported   O: Physical Exam  Constitutional: He appears well-developed and well-nourished.  Pulmonary/Chest: Effort normal.  Psychiatric: He has a normal mood and affect.  Vitals reviewed.  Review of Systems  Respiratory: Positive for shortness of breath.   All other systems reviewed and are negative.   Vitals:   03/31/18 1140  BP: 118/68  Pulse: 64  SpO2: 95%    mMRC score= > 2 CAT score= 22 See Documentation Flowsheet - CAT/COPD for complete symptom scoring.  See "scanned report" or Documentation Flowsheet (discrete results - PFTs) for Spirometry results. Patient provided good effort while attempting spirometry.   Lung Age = 105 Albuterol Neb  Lot# [id number]     Exp. 08/2019  A/P: Patient has been experiencing dyspnea for more than 1 year and taking PRN albuterol approximately daily for symptom relief.  Spirometry evaluation reveals possible restrictive lung disease. Post nebulized albuterol tx revealed improvement of ~ 14 % in FVC.  Not interested in tobacco cessation at this time.  Reviewed proper MDI inhaler technique. -begin Dulera 2 puffs twice daily  -Educated patient on purpose, proper use, potential adverse effects including risk of esophageal candidiasis and need to rinse mouth after each use.   -Reviewed results of pulmonary function tests.  Pt verbalized  understanding of results and education.    Written pt instructions provided.  F/U Clinic with PCP - Dr.  Annia FriendlyBeard on 12/19   Total time in face to face counseling 25 minutes.

## 2018-03-31 NOTE — Patient Instructions (Signed)
Great to meet you!  Please start Dulera Inhaler 2 puffs twice daily.  Continue Albuerol 2 puffs as needed for symptoms.   Follow up with Dr. Annia FriendlyBeard. On 12/19

## 2018-04-17 ENCOUNTER — Encounter: Payer: Self-pay | Admitting: Family Medicine

## 2018-04-17 ENCOUNTER — Ambulatory Visit (INDEPENDENT_AMBULATORY_CARE_PROVIDER_SITE_OTHER): Payer: Medicaid Other | Admitting: Family Medicine

## 2018-04-17 VITALS — BP 110/62 | HR 54 | Temp 98.2°F | Ht 75.0 in | Wt 223.2 lb

## 2018-04-17 DIAGNOSIS — Z8679 Personal history of other diseases of the circulatory system: Secondary | ICD-10-CM

## 2018-04-17 DIAGNOSIS — Z0000 Encounter for general adult medical examination without abnormal findings: Secondary | ICD-10-CM

## 2018-04-17 DIAGNOSIS — R0609 Other forms of dyspnea: Secondary | ICD-10-CM

## 2018-04-17 MED ORDER — ATORVASTATIN CALCIUM 20 MG PO TABS: 20.0000 mg | ORAL_TABLET | Freq: Every day | ORAL | 0 refills | Status: DC

## 2018-04-17 NOTE — Progress Notes (Signed)
   Subjective:    Patient ID: Javier Avila, male    DOB: May 11, 1955, 62 y.o.   MRN: [id number]   CC: DOE follow-up  HPI: [name] is a 62 year old gentleman presenting as a follow-up for his shortness of breath:  DOE: Recently had PFTs performed by Dr. Raymondo BandKoval, showing a restrictive lung pattern.  Prescribed Dulera at that time.  [facility] his shortness of breath, now using his albuterol less (states "occasionally", unable to quantify how often). Still walking several miles a day, hasn't noticed shortness of breath as much. Denies any coughing, sputum production, chest pain, or palpitations. Still smoking, does not wish to cut back at this time. Has never seen a pulmonogist before.   Smoking status reviewed  Review of Systems Per HPI, also denies recent illness, fever, headache, changes in vision, chest pain, shortness of breath, abdominal pain, N/V/D, weakness   Patient Active Problem List   Diagnosis Date Noted  . Falls infrequently 02/28/2018  . Dyspnea on exertion 01/27/2018  . Health care maintenance 12/05/2017  . Hyperlipidemia 12/06/2015  . History of hypertension 12/06/2015  . Pre-diabetes 12/06/2015  . Chronic lower back pain 12/06/2015  . Heel pain, bilateral 12/06/2015     Objective:  BP 110/62   Pulse (!) 54   Temp 98.2 F (36.8 C) (Oral)   Ht 6\' 3"  (1.905 m)   Wt 223 lb 3.2 oz (101.2 kg)   SpO2 97%   BMI 27.90 kg/m  Vitals and nursing note reviewed  General: NAD, pleasant Cardiac: RRR, normal heart sounds, no murmurs Respiratory: CTAB, no increased WOB, satting well on RA.  Abdomen: soft, nontender, nondistended Extremities: no edema or cyanosis. WWP. Skin: warm and dry, no rashes noted Neuro: alert and oriented, no focal deficits Psych: normal affect  Assessment & Plan:    Dyspnea on exertion Chronic, Improving with daily dulera and albuterol PRN. PFTs completed in the office showing likely restrictive lung disease, however  would have expected obstructive given his extensive pack year history. DOE likely 2/2 lung etiology, less likely deconditioning as patient regularly active, unlikely cardiac as atypical presentation but will proceed with echocardiogram on follow up per patient request (wanting to wait until he sees the pulmonologist), anemia r/o. Will refer do pulmonology for additional work up of restrictive lung disease, consider formal PFTs.  -Referral to pulmonary, appreciate additional recommendations  -Cont dulera, albuterol PRN  -Echo likely on f/u  -f/u in approximately 2 months after hopeful pulmonary evaluation   History of hypertension BP 110/62 in the office, on no controlling medications. Has been normal x3 visits.    Health care maintenance -ASCVD risk 11.3%, started on Atorvastatin 20mg  today, will titrate ultimately to 40mg  for high intensity. Patient counseled on potential side effects. Baseline LFTs: AST 28, ALT 31 -Patient due for Tdap and pneumonia vaccinations, however patient wanting to postpone these >> to do on follow up   -Due for screening chest CT   F/u in approximately 2 months, or sooner as needed   Leticia PennaSamantha Tjay Velazquez, DO Family Medicine Resident PGY-1

## 2018-04-17 NOTE — Patient Instructions (Signed)
So nice to see you in the clinic today.  For your chronic shortness of breath while walking, are lung function test in the office showed that you may have restrictive lungs, meaning they are not filling all the way with air.  We are going to send you to the lung doctors, you should receive a phone call to schedule an appointment with them shortly.  We also are going to start you on a medication called atorvastatin today, this helps with cholesterol and helps reduce your risk of having another heart attack.  I will start you on a short dose, and we will increase that in the future.  Please let me know if you start having muscle aches.  I will see you back after you follow-up with pulmonary, around about 2 months.  I hope you have a wonderful holiday!

## 2018-04-20 ENCOUNTER — Encounter: Payer: Self-pay | Admitting: Family Medicine

## 2018-04-20 NOTE — Assessment & Plan Note (Signed)
-  ASCVD risk 11.3%, started on Atorvastatin 20mg  today, will titrate ultimately to 40mg  for high intensity. Patient counseled on potential side effects. Baseline LFTs: AST 28, ALT 31 -Patient due for Tdap and pneumonia vaccinations, however patient wanting to postpone these >> to do on follow up   -Due for screening chest CT

## 2018-04-20 NOTE — Assessment & Plan Note (Signed)
Chronic, Improving with daily dulera and albuterol PRN. PFTs completed in the office showing likely restrictive lung disease, however would have expected obstructive given his extensive pack year history. DOE likely 2/2 lung etiology, less likely deconditioning as patient regularly active, unlikely cardiac as atypical presentation but will proceed with echocardiogram on follow up per patient request (wanting to wait until he sees the pulmonologist), anemia r/o. Will refer do pulmonology for additional work up of restrictive lung disease, consider formal PFTs.  -Referral to pulmonary, appreciate additional recommendations  -Cont dulera, albuterol PRN  -Echo likely on f/u  -f/u in approximately 2 months after hopeful pulmonary evaluation

## 2018-04-20 NOTE — Assessment & Plan Note (Signed)
BP 110/62 in the office, on no controlling medications. Has been normal x3 visits.

## 2018-08-07 ENCOUNTER — Telehealth: Payer: Self-pay | Admitting: Family Medicine

## 2018-08-07 NOTE — Telephone Encounter (Signed)
Pt called this morning wanting to inform PCP that he hasn't heard from the lung specialist and would like PCP to reach out to them, and to give him a call.

## 2018-08-15 NOTE — Telephone Encounter (Signed)
Called patient to let him know to call [facility] to schedule his appointment, they have already reached out to him several times without response.   Clinic # [phone]   Thank you,  Allayne Stack, DO

## 2018-08-22 ENCOUNTER — Encounter: Payer: Self-pay | Admitting: Internal Medicine

## 2018-08-22 ENCOUNTER — Other Ambulatory Visit: Payer: Self-pay

## 2018-08-22 ENCOUNTER — Ambulatory Visit (INDEPENDENT_AMBULATORY_CARE_PROVIDER_SITE_OTHER): Payer: Medicaid Other

## 2018-08-22 ENCOUNTER — Ambulatory Visit (INDEPENDENT_AMBULATORY_CARE_PROVIDER_SITE_OTHER): Payer: Medicaid Other | Admitting: Internal Medicine

## 2018-08-22 VITALS — BP 140/64 | HR 70 | Temp 98.1°F | Ht 76.0 in | Wt 216.0 lb

## 2018-08-22 DIAGNOSIS — J449 Chronic obstructive pulmonary disease, unspecified: Secondary | ICD-10-CM

## 2018-08-22 DIAGNOSIS — F17210 Nicotine dependence, cigarettes, uncomplicated: Secondary | ICD-10-CM | POA: Diagnosis not present

## 2018-08-22 IMAGING — DX CHEST - 2 VIEW
2 series · 2 of 2 positions shown · non-contrast
Comparison: None.

CLINICAL DATA: COPD

EXAM:
CHEST - 2 VIEW

[chest pa]
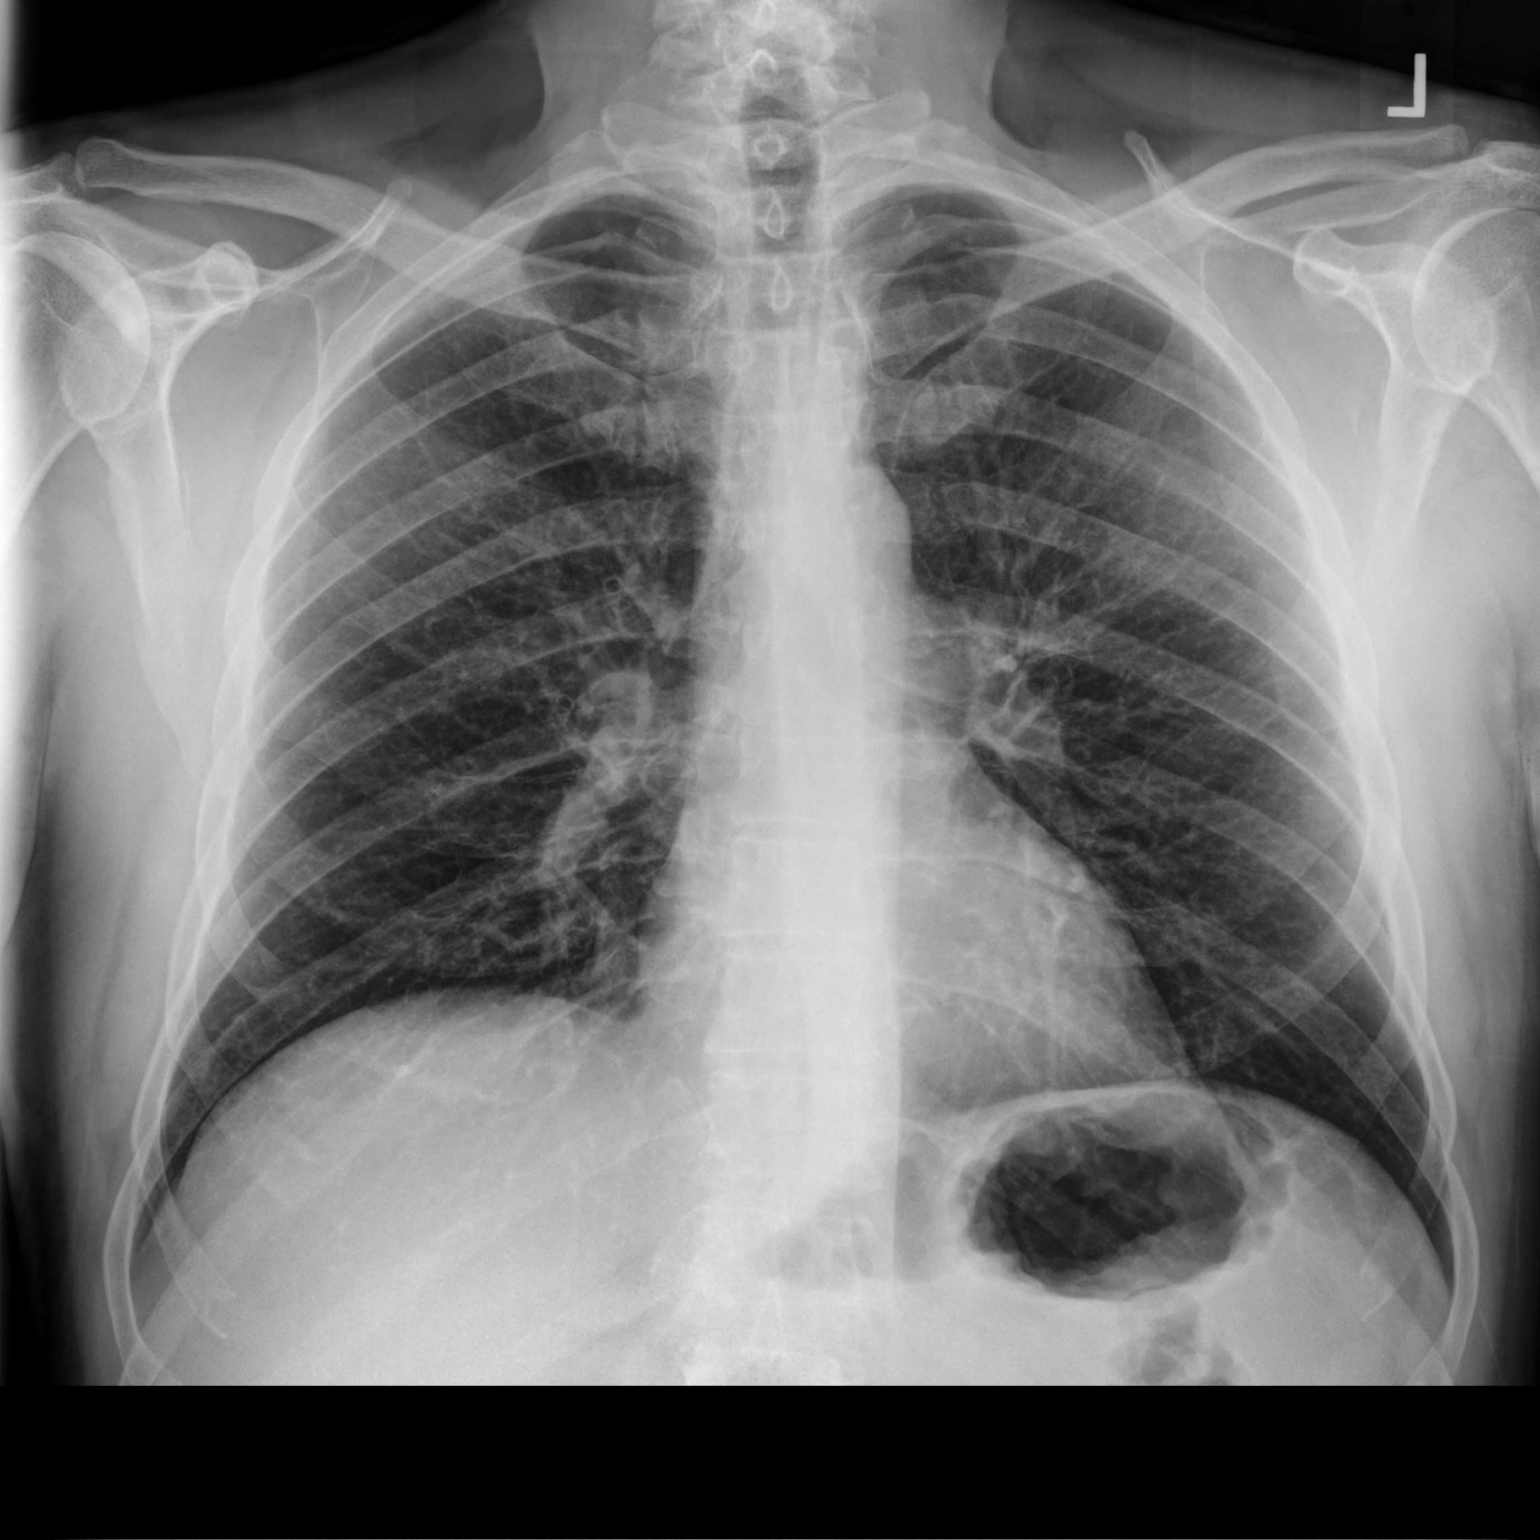

[chest lat]
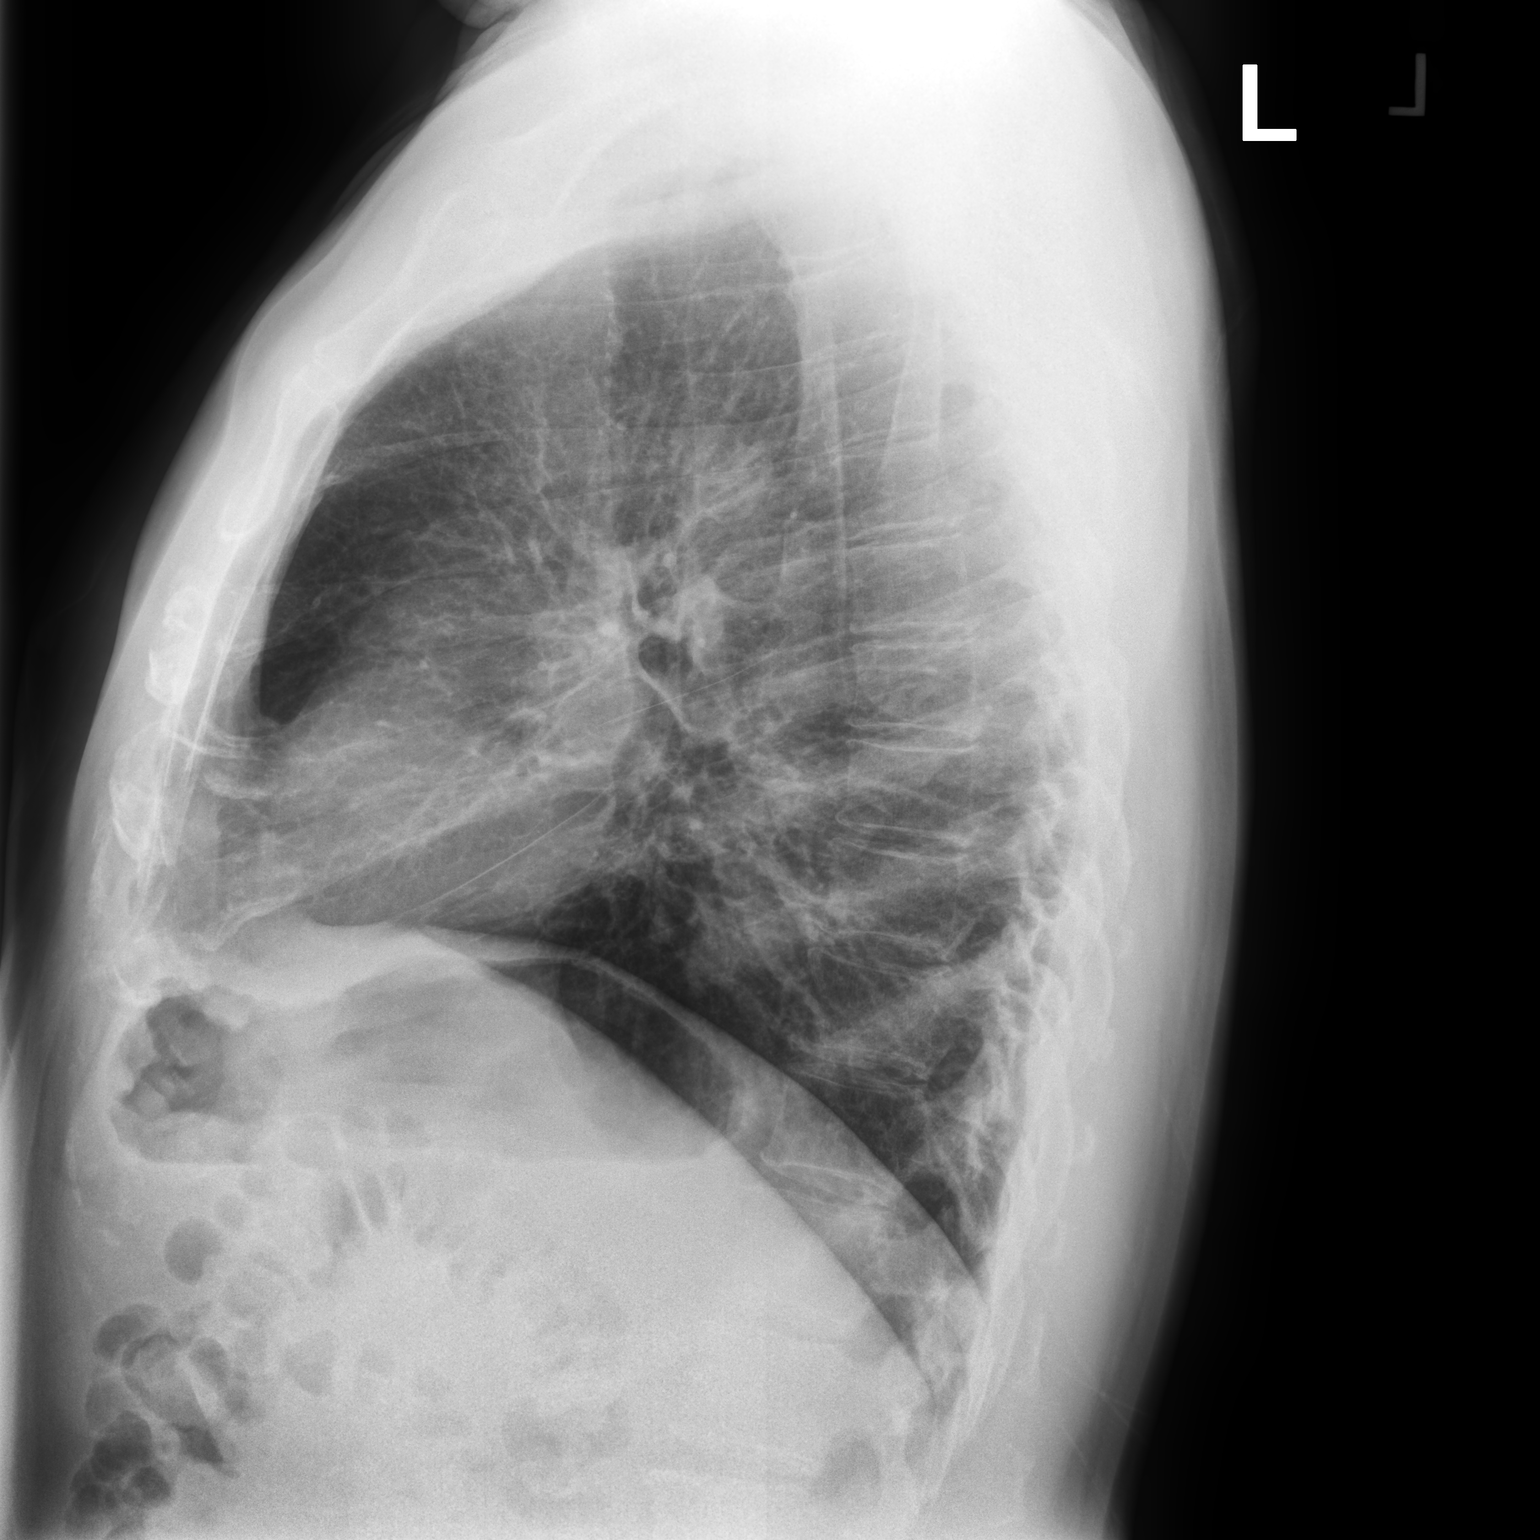

[2 of 2 positions shown; findings below may reference images not displayed]

FINDINGS: Normal heart size. Lungs clear. No pneumothorax. No pleural
effusion.
IMPRESSION: No active cardiopulmonary disease.

## 2018-08-22 NOTE — Patient Instructions (Signed)
Plan A = Automatic = dulera 200 up to 2 puffs every 12 hours if you feel it's helping help  Work on inhaler technique:  relax and gently blow all the way out then take a nice smooth deep breath back in, triggering the inhaler at same time you start breathing in.  Hold for up to 5 seconds if you can. Blow out thru nose. Rinse and gargle with water when done      Plan B = Backup Only use your albuterol inhaler(proair)  as a rescue medication to be used if you can't catch your breath by resting or doing a relaxed purse lip breathing pattern.  - The less you use it, the better it will work when you need it. - Ok to use the inhaler up to 2 puffs  every 4 hours if you must but call for appointment if use goes up over your usual need - Don't leave home without it !!  (think of it like the spare tire for your car)   Plan C = Crisis - only use your albuterol nebulizer if you first try Plan B and it fails to help > ok to use the nebulizer up to every 4 hours but if start needing it regularly call for immediate appointment   Please remember to go to the  x-ray department  for your tests - we will call you with the results when they are available      If you are satisfied with your treatment plan,  let your doctor know and he/she can either refill your medications or you can return here when your prescription runs out.     If in any way you are not 100% satisfied,  please tell us.  If 100% better, tell your friends!  Pulmonary follow up is as needed  - call if you decide you want to enter the lung cancer screening program but honestly stopping smoking will have a bigger impact on your lung and heart health

## 2018-08-22 NOTE — Progress Notes (Signed)
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   History of Present Illness  08/22/2018  Pulmonary/ 1st office eval/Noraa Pickeral  Chief Complaint  Patient presents with  . Pulmonary Consult    Referred by Dr. Terisa Starr. Pt c/o DOE for the past year. He has occ cough with clear sputum.   Dyspnea:  MMRC1 = can walk nl pace, flat grade, can't hurry or go uphills or steps s sob   Cough: denies now - last flare winter of 2020 s HCP care got better on his own  Sleep: on side or prone bed is flat  SABA use: no saba needed now    No obvious patterns in day to day or daytime variability or assoc   purulent sputum or mucus plugs or hemoptysis or cp or chest tightness, subjective wheeze or overt sinus or hb symptoms.   Sleeping  without nocturnal  or early am exacerbation  of respiratory  c/o's or need for noct saba. Also denies any obvious fluctuation of symptoms with weather or environmental changes or other aggravating or alleviating factors except as outlined above   No unusual exposure hx or h/o childhood pna/ asthma or knowledge of premature birth.  Current Allergies, Complete Past Medical History, Past Surgical History, Family History, and Social History were reviewed in [employer] record.  ROS  The following are not active complaints unless bolded Hoarseness, sore throat, dysphagia, dental problems, itching, sneezing,  nasal congestion or discharge of excess mucus or purulent secretions, ear ache,   fever, chills, sweats, unintended wt loss or wt gain, classically pleuritic or exertional cp,  orthopnea pnd or arm/hand swelling  or leg swelling, presyncope, palpitations, abdominal  pain, anorexia, nausea, vomiting, diarrhea  or change in bowel habits or change in bladder habits, change in stools or change in urine, dysuria, hematuria,  rash, arthralgias, visual complaints, headache, numbness, weakness or ataxia or problems with walking or coordination,  change in mood or  memory.                Past Medical History:  Diagnosis Date  . Arthritis   . Falls infrequently 02/28/2018  . Heel pain, bilateral 12/06/2015  . Hypertension   . Lower back pain   . Stroke [facility])     Outpatient Medications Prior to Visit  Medication Sig Dispense Refill  . albuterol (PROVENTIL HFA;VENTOLIN HFA) 108 (90 Base) MCG/ACT inhaler Inhale 2 puffs into the lungs every 6 (six) hours as needed for wheezing or shortness of breath. 1 Inhaler 2  . atorvastatin (LIPITOR) 20 MG tablet Take 1 tablet (20 mg total) by mouth daily. At night, with a meal. 90 tablet 0  . diclofenac sodium (VOLTAREN) 1 % GEL Apply 4 g topically 4 (four) times daily. 100 g 0  . ibuprofen (ADVIL,MOTRIN) 200 MG tablet Take 400 mg by mouth every 6 (six) hours as needed for headache, mild pain or moderate pain.    . meloxicam (MOBIC) 7.5 MG tablet Take 7.5 mg by mouth daily.    . mometasone-formoterol (DULERA) 200-5 MCG/ACT AERO Inhale 2 puffs into the lungs 2 (two) times daily. 1 Inhaler 5  . nitroGLYCERIN (  NITROSTAT) 0.4 MG SL tablet Place 1 tablet (0.4 mg total) under the tongue every 5 (five) minutes as needed for chest pain. 50 tablet 3      Objective:     BP 140/64 (BP Location: Left Arm, Cuff Size: Normal)   Pulse 70   Temp 98.1 F (36.7 C) (Oral)   Ht 6\' 4"  (1.93 m)   Wt 216 lb (98 kg)   SpO2 97%   BMI 26.29 kg/m   SpO2: 97 %  RA  amb somber/stoic wm minimizing symptoms and impact of cigs on health   HEENT: nl dentition, turbinates bilaterally, and oropharynx. Nl external ear canals without cough reflex   NECK :  without JVD/Nodes/TM/ nl carotid upstrokes bilaterally   LUNGS: no acc muscle use,   Nl contour chest which is clear to A and P bilaterally without cough on insp or exp maneuvers   CV:  RRR  no s3 or murmur or increase in P2, and no edema   ABD:  soft and nontender with nl inspiratory excursion in the supine position. No bruits or organomegaly appreciated, bowel sounds nl  MS:  Nl gait/ ext warm without deformities, calf tenderness, cyanosis or clubbing No obvious joint restrictions   SKIN: warm and dry without lesions    NEURO:  alert, approp, nl sensorium with  no motor or cerebellar deficits apparent.     CXR PA and Lateral:   08/22/2018 :    I personally reviewed images and agree with radiology impression as follows:   No active cardiopulmonary disease.       Assessment   COPD GOLD 0/ still smoking  Active smoker  - PFT's  03/31/18  FEV1 2.55 (58 % ) ratio 0.77 And no significant curvature (not clear whether used dulera prior)  - 08/22/2018   Walked RA  2 laps @  approx 26550ft each @ fast pace  stopped due to  End of study, no sob - 08/22/2018  After extensive coaching inhaler device,  effectiveness =    50% with hfa > continue dulera 200 2bid    > 3 min discussion I reviewed the Fletcher curve with the patient that basically indicates  if you quit smoking when your best day FEV1 is still well preserved (as is clearly  the case here)  it is highly unlikely you will progress to severe disease and informed the patient there was  no medication on the market that has proven to alter the curve/ its downward trajectory  or the likelihood of progression of their disease(unlike other chronic medical conditions such as atheroclerosis where we do think we can change the natural hx with risk reducing meds)    Therefore stopping smoking and maintaining abstinence are  the most important aspects of care, not choice of inhalers or for that matter, doctors.   >>> Treatment other than smoking cessation  is entirely directed by severity of symptoms and focused also on reducing  exacerbations, not attempting to change the natural history of the disease.  If his symptoms are better on dulera 200 he can use it up to 2 pffs q 12 hours but if stops smoking there's every reason to think he would not need it based on above spirometry which would be more c/w completely reversible or zero airflow obstruction (depending on whether he used dulera prior).     Cigarette smoker 4-5 min discussion re active cigarette smoking in addition to office E&M  Ask about tobacco use:  ongoing Advise quitting:   Advised strongly but  I encountered a very high level of denial re the science relating to tobacco to any illness so it's unlikely he will be convinced to stops smoking until/ unless smoking completely stops him. Assess willingness:  Not even close to committed at this point Assist in quit attempt:  Per PCP when ready Arrange follow up:   Follow up per Primary Care planned  For smoking cessation classes/infor call [phone]    >>>> Advised:   Low-dose CT lung cancer screening is recommended for patients who are 36-80 years of age with a 30+ pack-year history of smoking, andwho are currently smoking or quit <=15 years ago.  If interested can call to scheduled informed decision making ov with our NP once  COVID - 19 restrictions have been lifted        Total time devoted to counseling  > 50 % of initial 60 min office visit:  review case with pt/ See device teaching which extended face to face time for this visit as did observing portions of ambulatory 02 sat  study / discussion of options/alternatives/ personally creating written customized instructions  in presence of pt  then going over those specific  Instructions directly with the pt including how to use all of the meds but in particular covering each new medication in detail and the difference between the maintenance= "automatic" meds and the prns using an action plan format for the latter (If this problem/symptom => do that  organization reading Left to right).  Please see AVS from this visit for a full list of these instructions which I personally wrote for this pt and  are unique to this visit.    Sandrea Hughs, MD 08/22/2018

## 2018-08-23 ENCOUNTER — Encounter: Payer: Self-pay | Admitting: Internal Medicine

## 2018-08-23 DIAGNOSIS — F17210 Nicotine dependence, cigarettes, uncomplicated: Secondary | ICD-10-CM | POA: Insufficient documentation

## 2018-08-23 NOTE — Assessment & Plan Note (Addendum)
4-5 min discussion re active cigarette smoking in addition to office E&M  Ask about tobacco use:   ongoing Advise quitting:   Advised strongly but  I encountered a very high level of denial re the science relating to tobacco to any illness so it's unlikely he will be convinced to stops smoking until/ unless smoking completely stops him. Assess willingness:  Not even close to committed at this point Assist in quit attempt:  Per PCP when ready Arrange follow up:   Follow up per Primary Care planned  For smoking cessation classes/infor call [phone]    >>>> Advised:   Low-dose CT lung cancer screening is recommended for patients who are 71-54 years of age with a 30+ pack-year history of smoking, andwho are currently smoking or quit <=15 years ago.  If interested can call to scheduled informed decision making ov with our NP once  COVID - 19 restrictions have been lifted   Total time devoted to counseling  > 50 % of initial 60 min office visit:  review case with pt/ See device teaching which extended face to face time for this visit as did observing portions of ambulatory 02 sats study / discussion of options/alternatives/ personally creating written customized instructions  in presence of pt  then going over those specific  Instructions directly with the pt including how to use all of the meds but in particular covering each new medication in detail and the difference between the maintenance= "automatic" meds and the prns using an action plan format for the latter (If this problem/symptom => do that organization reading Left to right).  Please see AVS from this visit for a full list of these instructions which I personally wrote for this pt and  are unique to this visit.    Marland Kitchen

## 2018-08-23 NOTE — Assessment & Plan Note (Addendum)
Active smoker  - PFT's  03/31/18  FEV1 2.55 (58 % ) ratio 0.77 And no significant curvature (not clear whether used dulera prior)  - 08/22/2018   Walked RA  2 laps @  approx 257ft each @ fast pace  stopped due to  End of study, no sob - 08/22/2018  After extensive coaching inhaler device,  effectiveness =    50% with hfa > continue dulera 200 2bid    > 3 min discussion I reviewed the Fletcher curve with the patient that basically indicates  if you quit smoking when your best day FEV1 is still well preserved (as is clearly  the case here)  it is highly unlikely you will progress to severe disease and informed the patient there was  no medication on the market that has proven to alter the curve/ its downward trajectory  or the likelihood of progression of their disease(unlike other chronic medical conditions such as atheroclerosis where we do think we can change the natural hx with risk reducing meds)    Therefore stopping smoking and maintaining abstinence are  the most important aspects of care, not choice of inhalers or for that matter, doctors.   >>> Treatment other than smoking cessation  is entirely directed by severity of symptoms and focused also on reducing exacerbations, not attempting to change the natural history of the disease.  If his symptoms are better on dulera 200 he can use it up to 2 pffs q 12 hours but if stops smoking there's every reason to think he would not need it based on above spirometry which would be more c/w completely reversible or zero airflow obstruction (depending on whether he used dulera prior).

## 2018-08-25 NOTE — Progress Notes (Signed)
Spoke with pt and notified of results per Dr. Wert. Pt verbalized understanding and denied any questions. 

## 2018-12-09 ENCOUNTER — Ambulatory Visit (INDEPENDENT_AMBULATORY_CARE_PROVIDER_SITE_OTHER): Payer: Medicaid Other | Admitting: Family Medicine

## 2018-12-09 ENCOUNTER — Other Ambulatory Visit: Payer: Self-pay

## 2018-12-09 DIAGNOSIS — I209 Angina pectoris, unspecified: Secondary | ICD-10-CM

## 2018-12-09 DIAGNOSIS — M545 Low back pain, unspecified: Secondary | ICD-10-CM

## 2018-12-09 MED ORDER — NAPROXEN 500 MG PO TABS: 500.0000 mg | ORAL_TABLET | Freq: Two times a day (BID) | ORAL | 0 refills | Status: DC

## 2018-12-09 MED ORDER — BACLOFEN 10 MG PO TABS: 10.0000 mg | ORAL_TABLET | Freq: Three times a day (TID) | ORAL | 0 refills | Status: DC | PRN

## 2018-12-09 NOTE — Patient Instructions (Addendum)
So wonderful to see you.  I have sent in a muscle relaxer, baclofen that you can use to help with spasms, also you can take naproxen twice daily and Tylenol 650 mg every 4-6 hours as needed for pain.  I will also be placing a cardiology referral for further monitoring of your chest pains, history of heart attack.  Make sure that you are taking aspirin 81 mg daily and continuing on your statin.  I would like to see you back in approximately 1 month for follow-up or sooner if worsening.

## 2018-12-09 NOTE — Progress Notes (Signed)
Subjective:    Patient ID: Javier Avila, male    DOB: 11/01/1955, 63 y.o.   MRN: [id number]   CC: "Back pain"   HPI: [name] is a 63 year old gentleman presenting discuss the following:  Patient is a poor historian.  Acute on chronic L back pain: Has a known history of low back pain for the past 10 years, previously had extensive imaging performed while he lived in [place] showing several minorly bulging disks.  Has not been interested in any surgical interventions in the past.  He currently endorses a flaring up of pain for the past week.  He carried his son's girlfriend (adult) up a hill, approximately 15 yards per report.  Felt soreness starting in his left lower back after this.  States it feels like it is throbbing with spasms in his muscles.  Has occasional paresthesias down his posterior leg when walking, but denies any bowel/bladder incontinence, point spinal tenderness, saddle anesthesia, numbness, fever, rash, or weakness of bilateral lower extremities.  Notes it is slightly worse with sitting or when he is sedentary for a long time.  He has been using some Voltaren gel which is helping some and has tried ibuprofen occasionally without relief.  Angina: Incidentally during conversation, endorses he has had exertional chest pain on a few occurrences that was relieved with his nitro.  Describes it as substernal clenching, pressure-like pain, also resolves with rest.  Only occurred 3 times in the last few months, is not experiencing any chest pain currently.  However, also states he had similar events that were more burning in nature while at rest on a few occasions.  Previously also had concerns with dyspnea on exertion, was felt to be secondary to lung etiology at that time. Has a reported distant history of MI.  Taking a daily low-dose aspirin and atorvastatin 20 mg.  Is not established with a cardiologist in [place].  Strong pack-year history, current smoker, not interested in  quitting or cutting back at this time.    Smoking status reviewed  Review of Systems Per HPI   Patient Active Problem List   Diagnosis Date Noted   Acute lumbar back pain 12/10/2018   Angina pectoris ([place]) 12/10/2018   Cigarette smoker 08/23/2018   COPD GOLD 0/ still smoking  08/22/2018   Dyspnea on exertion 01/27/2018   Health care maintenance 12/05/2017   Hyperlipidemia 12/06/2015   History of hypertension 12/06/2015   Pre-diabetes 12/06/2015   Chronic lower back pain 12/06/2015     Objective:  BP 117/60    Pulse 62    SpO2 96%  Vitals and nursing note reviewed  General: NAD, pleasant Cardiac: RRR, normal heart sounds, no murmurs, palpable distal pulses Respiratory: CTAB, unlabored breathing without any wheezing, crackles noted, satting appropriately on room air Abdomen: soft, nontender, nondistended Extremities: no edema or cyanosis. WWP. MSK: No deformities or injuries noted to lumbar spine and bilateral LE.  5/5 muscle strength bilateral lower extremity, plantar reflexes 2/4 bilaterally.  Sensation to light touch intact. Straight leg raise negative bilaterally.  Quite tender to palpation of left paraspinal musculature around lumbar 3-5 with noted tightness. Skin: warm and dry, no rashes noted Neuro: alert and oriented, no focal deficits Psych: normal affect  Assessment & Plan:   Acute lumbar back pain In the setting of known chronic lumbar back pain. 1 week history of left-sided lumbar muscular spasms with a negative straight leg raise after heavy lifting.  No associated red flags, neurologically intact on exam.  Suspect secondary to muscular strain, could also consider herniated disc precipitating strain given intermittent paresthesias and worse with sitting. - Naproxen 500 mg twice daily (instructed not to take with ibuprofen or Mobic) - Baclofen 10 mg 3 times daily, however may have limited utility given concurrent NSAID - Discussed back  exercises/stretches - May use ice/heat as needed - Return precautions discussed, including development of worsening pain, weakness, bowel/bladder dysfunction etc. - Follow-up if symptoms not improving or sooner if worsening  Angina pectoris (HCC) Associated CAD, previous MI.  No current chest pain.  Endorses few episodes of typical angina relieved with rest/nitro in the past few months and likely has had this prior to this time as well.  While it seems these episodes are most consistent with stable angina, difficult to to really assess if he is having some episodes at rest as he endorses similar events on occasion while relaxing. - Encouraged continue aspirin 81 mg daily - Discussed increasing atorvastatin to 40 mg today, patient declines at this time and wants to wait until his back is better - Ambulatory referral to cardiology, appreciate further evaluation - Nitro PRN, return precautions discussed   Follow-up in 1 month or sooner if needed.  Leticia PennaSamantha Robbye Dede DO Family Medicine Resident PGY-2

## 2018-12-10 ENCOUNTER — Encounter: Payer: Self-pay | Admitting: Family Medicine

## 2018-12-10 DIAGNOSIS — I209 Angina pectoris, unspecified: Secondary | ICD-10-CM | POA: Insufficient documentation

## 2018-12-10 DIAGNOSIS — M545 Low back pain, unspecified: Secondary | ICD-10-CM | POA: Insufficient documentation

## 2018-12-10 NOTE — Assessment & Plan Note (Signed)
In the setting of known chronic lumbar back pain. 1 week history of left-sided lumbar muscular spasms with a negative straight leg raise after heavy lifting.  No associated red flags, neurologically intact on exam. Suspect secondary to muscular strain, could also consider herniated disc precipitating strain given intermittent paresthesias and worse with sitting. - Naproxen 500 mg twice daily (instructed not to take with ibuprofen or Mobic) - Baclofen 10 mg 3 times daily, however may have limited utility given concurrent NSAID - Discussed back exercises/stretches - May use ice/heat as needed - Return precautions discussed, including development of worsening pain, weakness, bowel/bladder dysfunction etc. - Follow-up if symptoms not improving or sooner if worsening

## 2018-12-10 NOTE — Assessment & Plan Note (Signed)
Associated CAD, previous MI.  No current chest pain.  Endorses few episodes of typical angina relieved with rest/nitro in the past few months and likely has had this prior to this time as well.  While it seems these episodes are most consistent with stable angina, difficult to to really assess if he is having some episodes at rest as he endorses similar events on occasion while relaxing. - Encouraged continue aspirin 81 mg daily - Discussed increasing atorvastatin to 40 mg today, patient declines at this time and wants to wait until his back is better - Ambulatory referral to cardiology, appreciate further evaluation - Nitro PRN, return precautions discussed

## 2018-12-25 ENCOUNTER — Other Ambulatory Visit: Payer: Self-pay | Admitting: Family Medicine

## 2018-12-25 DIAGNOSIS — I209 Angina pectoris, unspecified: Secondary | ICD-10-CM

## 2019-01-10 ENCOUNTER — Other Ambulatory Visit: Payer: Self-pay | Admitting: Family Medicine

## 2019-10-18 ENCOUNTER — Other Ambulatory Visit: Payer: Self-pay | Admitting: Family Medicine

## 2019-10-18 DIAGNOSIS — R0609 Other forms of dyspnea: Secondary | ICD-10-CM

## 2019-11-30 ENCOUNTER — Encounter: Payer: Self-pay | Admitting: Family Medicine

## 2019-11-30 ENCOUNTER — Ambulatory Visit: Payer: Medicaid Other | Admitting: Family Medicine

## 2019-11-30 ENCOUNTER — Other Ambulatory Visit: Payer: Self-pay

## 2019-11-30 VITALS — BP 128/72 | HR 62 | Wt 213.2 lb

## 2019-11-30 DIAGNOSIS — F17210 Nicotine dependence, cigarettes, uncomplicated: Secondary | ICD-10-CM | POA: Diagnosis not present

## 2019-11-30 DIAGNOSIS — J449 Chronic obstructive pulmonary disease, unspecified: Secondary | ICD-10-CM

## 2019-11-30 DIAGNOSIS — E785 Hyperlipidemia, unspecified: Secondary | ICD-10-CM

## 2019-11-30 DIAGNOSIS — Z0000 Encounter for general adult medical examination without abnormal findings: Secondary | ICD-10-CM | POA: Diagnosis not present

## 2019-11-30 DIAGNOSIS — M545 Low back pain: Secondary | ICD-10-CM

## 2019-11-30 DIAGNOSIS — I209 Angina pectoris, unspecified: Secondary | ICD-10-CM | POA: Diagnosis not present

## 2019-11-30 DIAGNOSIS — G8929 Other chronic pain: Secondary | ICD-10-CM | POA: Diagnosis not present

## 2019-11-30 MED ORDER — ISOSORBIDE MONONITRATE ER 30 MG PO TB24: 30.0000 mg | ORAL_TABLET | Freq: Every day | ORAL | 0 refills | Status: DC

## 2019-11-30 MED ORDER — NITROGLYCERIN 0.4 MG SL SUBL: 0.4000 mg | SUBLINGUAL_TABLET | SUBLINGUAL | 0 refills | Status: DC | PRN

## 2019-11-30 MED ORDER — ATORVASTATIN CALCIUM 40 MG PO TABS: 40.0000 mg | ORAL_TABLET | Freq: Every day | ORAL | 0 refills | Status: DC

## 2019-11-30 NOTE — Progress Notes (Signed)
    SUBJECTIVE:   CHIEF COMPLAINT / HPI: " Discuss issues"  Mr. Schoenberger is a 64 year old gentleman presenting for follow-up.  Last seen in 11/2018.  Request for motorized chair  Chronic back pain: Reports he was previously on disability while he lived in [place] and had a motorized chair.  He is requesting to have a chair again here due to his chronic back pain and intermittent knee pain that flares up after long bouts of walking.  Feels like doing his ADLs around the house can be challenging sometimes.  Does not want any surgery.  Would be interested in spinal/knee injections, however his knees aren't particularly bothering him currently.  However, reports he is very active throughout every day.  Chest pain: History of inferior MI in [place] several years ago.  Reported anterior chest pain a year ago when seen, referred to cardiology at that time however did not go through.  He requests a refill of his nitro today, states he will have chest pain intermittently relieved by nitro.  Unable to specify how often he has this and how long it lasts, but says he usually feels it at night or after walking for a long time.  COPD: Compliant with his daily inhaler.  Feels like his breathing is doing well, has no concerns.  Health maintenance: due to COVID vaccine however declines, tdap, low dose CT lung cancer screening   PERTINENT  PMH / PSH: Previous MI, COPD, hyperlipidemia, chronic back pain  OBJECTIVE:   BP 128/72   Pulse 62   Wt 213 lb 3.2 oz (96.7 kg)   SpO2 95%   BMI 25.95 kg/m   General: Alert, NAD HEENT: NCAT Cardiac: RRR no m/g/r appreciated  Lungs: Clear bilaterally, no increased WOB, no wheezing Msk: Moves all extremities spontaneously.  Normal gait, mild imbalance from sitting to standing briefly.  5/5 lower extremity strength bilaterally.  No tenderness to palpation of medial/lateral/anterior knee joint lines bilaterally.  No palpable knee effusion bilaterally. Ext: Warm, dry,  2+ distal pulses, no edema   ASSESSMENT/PLAN:   Angina pectoris (HCC) Stable, chronic. Previous MI. Will replace referral to cardiology and start low-dose Imdur 30mg  to help angina.  Decided against BB as heart rate already consistently in low 60s.  Refilled nitro as needed.  Continue home ASA and statin.  Hyperlipidemia Obtain lipid panel and increase atorvastatin to 40 mg from 20 today.  Chronic lower back pain Adamant request for motorized wheelchair as he previously had while in , during previous visits have discussed it was not appropriate at this time given stable gait and strength with ability to perform ADLs at home.  However, as he reports worsening difficulty with ADLs will place order, discussed possibility this may not be covered by insurance.  In the meantime, will refer to orthopedic surgery for evaluation of injection/alternative therapy that may be beneficial in improvement of chronic back pain. Cont ice/heat, Tylenol PRN.   Health care maintenance Educated on Covid vaccination, patient not interested at this time.  Will discuss low-dose screening CT scan on follow-up.  COPD GOLD 0/ still smoking  Chronic, stable.  Continue daily Dulera and albuterol as needed.  Cigarette smoker No desire to quit at this time.    Follow-up in 3 months or sooner if needed.  ED precautions discussed including any persistent severe chest pain or difficulty breathing.  [place], DO [facility]

## 2019-11-30 NOTE — Patient Instructions (Signed)
Wonderful to see you today.   We are going to refer you to cardiology and orthopedics today-- please call if you have not heard from them in the next 2-3 weeks.   If you have any persistent worsening chest pain, shortness of breath, lightheadedness/dizziness--please call/go to the ED.

## 2019-12-01 ENCOUNTER — Encounter: Payer: Self-pay | Admitting: Family Medicine

## 2019-12-01 LAB — BASIC METABOLIC PANEL
BUN/Creatinine Ratio: 14 (ref 10–24)
BUN: 12 mg/dL (ref 8–27)
CO2: 22 mmol/L (ref 20–29)
Calcium: 9.5 mg/dL (ref 8.6–10.2)
Chloride: 107 mmol/L — ABNORMAL HIGH (ref 96–106)
Creatinine, Ser: 0.86 mg/dL (ref 0.76–1.27)
GFR calc Af Amer: 107 mL/min/{1.73_m2} (ref >59)
GFR calc non Af Amer: 93 mL/min/{1.73_m2} (ref >59)
Glucose: 84 mg/dL (ref 65–99)
Potassium: 4.3 mmol/L (ref 3.5–5.2)
Sodium: 143 mmol/L (ref 134–144)

## 2019-12-01 LAB — LIPID PANEL
Chol/HDL Ratio: 5.3 ratio — ABNORMAL HIGH (ref 0.0–5.0)
Cholesterol, Total: 180 mg/dL (ref 100–199)
HDL: 34 mg/dL — ABNORMAL LOW (ref >39)
LDL Chol Calc (NIH): 122 mg/dL — ABNORMAL HIGH (ref 0–99)
Triglycerides: 132 mg/dL (ref 0–149)
VLDL Cholesterol Cal: 24 mg/dL (ref 5–40)

## 2019-12-03 ENCOUNTER — Encounter: Payer: Self-pay | Admitting: Family Medicine

## 2019-12-03 NOTE — Assessment & Plan Note (Signed)
Obtain lipid panel and increase atorvastatin to 40 mg from 20 today.

## 2019-12-03 NOTE — Assessment & Plan Note (Signed)
Chronic, stable.  Continue daily Dulera and albuterol as needed.

## 2019-12-03 NOTE — Assessment & Plan Note (Signed)
No desire to quit at this time.

## 2019-12-03 NOTE — Assessment & Plan Note (Addendum)
Adamant request for motorized wheelchair as he previously had while in [place], during previous visits have discussed it was not appropriate at this time given stable gait and strength with ability to perform ADLs at home.  However, as he reports worsening difficulty with ADLs will place order, discussed possibility this may not be covered by insurance.  In the meantime, will refer to orthopedic surgery for evaluation of injection/alternative therapy that may be beneficial in improvement of chronic back pain. Cont ice/heat, Tylenol PRN.

## 2019-12-03 NOTE — Assessment & Plan Note (Addendum)
Stable, chronic. Previous MI. Will replace referral to cardiology and start low-dose Imdur 30mg  to help angina.  Decided against BB as heart rate already consistently in low 60s.  Refilled nitro as needed.  Continue home ASA and statin.

## 2019-12-03 NOTE — Assessment & Plan Note (Signed)
Educated on Covid vaccination, patient not interested at this time.  Will discuss low-dose screening CT scan on follow-up.

## 2019-12-08 ENCOUNTER — Other Ambulatory Visit: Payer: Self-pay | Admitting: Family Medicine

## 2019-12-08 DIAGNOSIS — G8929 Other chronic pain: Secondary | ICD-10-CM

## 2019-12-14 ENCOUNTER — Other Ambulatory Visit: Payer: Self-pay

## 2019-12-14 ENCOUNTER — Ambulatory Visit (INDEPENDENT_AMBULATORY_CARE_PROVIDER_SITE_OTHER): Payer: Medicaid Other

## 2019-12-14 ENCOUNTER — Ambulatory Visit (INDEPENDENT_AMBULATORY_CARE_PROVIDER_SITE_OTHER): Payer: Medicaid Other | Admitting: Family Medicine

## 2019-12-14 ENCOUNTER — Encounter: Payer: Self-pay | Admitting: Family Medicine

## 2019-12-14 DIAGNOSIS — M5441 Lumbago with sciatica, right side: Secondary | ICD-10-CM | POA: Diagnosis not present

## 2019-12-14 DIAGNOSIS — M542 Cervicalgia: Secondary | ICD-10-CM

## 2019-12-14 DIAGNOSIS — M5442 Lumbago with sciatica, left side: Secondary | ICD-10-CM

## 2019-12-14 DIAGNOSIS — G8929 Other chronic pain: Secondary | ICD-10-CM | POA: Diagnosis not present

## 2019-12-14 MED ORDER — BACLOFEN 10 MG PO TABS: 5.0000 mg | ORAL_TABLET | Freq: Three times a day (TID) | ORAL | 3 refills | Status: DC | PRN

## 2019-12-14 MED ORDER — MELOXICAM 15 MG PO TABS: 7.5000 mg | ORAL_TABLET | Freq: Every day | ORAL | 6 refills | Status: DC | PRN

## 2019-12-14 NOTE — Progress Notes (Signed)
Office Visit Note   Patient: Javier Avila           Date of Birth: Jan 01, 1956           MRN: [id number] Visit Date: 12/14/2019 Requested by: McDiarmid, Leighton Roach, MD [address] [place],  [place] [address] PCP: Allayne Stack, DO  Subjective: Chief Complaint  Patient presents with  . Lower Back - Pain  . Neck - Pain    HPI: He is here with neck and low back pain.  Longstanding problems with these areas.  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  Eventually he went out on disability.  He was living in another state.  He has tried over-the-counter medications but his neck and low back pain have persisted.  He has never been to a physical therapist or chiropractor, has never had injections.  He is not interested in surgery, but wonders whether injections might help.  No back notes more than the neck.               ROS: Denies any radicular pain.  All other systems were reviewed and are negative.  Objective: Vital Signs: There were no vitals taken for this visit.  Physical Exam:  General:  Alert and oriented, in no acute distress. Pulm:  Breathing unlabored. Psy:  Normal mood, congruent affect.  Neck: He has good range of motion with pain at the extremes.  He is tender mostly on the left side in the lower cervical paraspinous muscles.  Upper extremity strength and reflexes are normal. Low back: Tender in the midline at the L5-S1 area.  No pain in the sciatic notch.  Straight leg raise negative.  He has bilateral tight hamstrings.  Lower extremity strength and reflexes are normal.  Imaging: XR Cervical Spine 2 or 3 views  Result Date: 12/14/2019 X-ray cervical spine reveal moderate degenerative disc disease at C5-6, and moderate to severe at C6-7.  There is facet DJD at those levels as well.  Sign of compression fracture or neoplasm.  XR Lumbar Spine 2-3 Views  Result Date: 12/14/2019 X-rays lumbar spine reveal moderate degenerative disc  disease at L4-5 and L5-S1.  Alignment is otherwise anatomic, no sign of neoplasm or compression fracture.  Hip joints have mild degenerative change.   Assessment & Plan: 1.  Chronic neck and low back pain with degenerative changes -Elected to try meloxicam, baclofen as needed.  Referral for lumbar epidural injection. -Could contemplate cervical MRI followed by injection if needed. -Physical therapy would be another consideration.     Procedures: No procedures performed  No notes on file     PMFS History: Patient Active Problem List   Diagnosis Date Noted  . Acute lumbar back pain 12/10/2018  . Angina pectoris (HCC) 12/10/2018  . Cigarette smoker 08/23/2018  . COPD GOLD 0/ still smoking  08/22/2018  . Dyspnea on exertion 01/27/2018  . Health care maintenance 12/05/2017  . Hyperlipidemia 12/06/2015  . History of hypertension 12/06/2015  . Pre-diabetes 12/06/2015  . Chronic lower back pain 12/06/2015   Past Medical History:  Diagnosis Date  . Arthritis   . Falls infrequently 02/28/2018  . Heel pain, bilateral 12/06/2015  . Hypertension   . Lower back pain   . Stroke [facility])     Family History  Family history unknown: Yes    History reviewed. No pertinent surgical history. Social History   Occupational History  . Not on file  Tobacco  Use  . Smoking status: Current Every Day Smoker    Packs/day: 1.50    Years: 43.00    Pack years: 64.50    Types: Cigarettes    Start date: 04/30/1974  . Smokeless tobacco: Never Used  Vaping Use  . Vaping Use: Never used  Substance and Sexual Activity  . Alcohol use: No  . Drug use: Yes    Types: Marijuana    Comment: About once monthly when in severe pain or needs to relax  . Sexual activity: Not Currently

## 2019-12-14 NOTE — Progress Notes (Signed)
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

## 2019-12-22 ENCOUNTER — Other Ambulatory Visit: Payer: Self-pay | Admitting: Family Medicine

## 2019-12-22 DIAGNOSIS — I209 Angina pectoris, unspecified: Secondary | ICD-10-CM

## 2019-12-28 ENCOUNTER — Other Ambulatory Visit: Payer: Self-pay

## 2019-12-28 ENCOUNTER — Ambulatory Visit: Payer: Self-pay

## 2019-12-28 ENCOUNTER — Encounter: Payer: Self-pay | Admitting: Physical Medicine and Rehabilitation

## 2019-12-28 ENCOUNTER — Ambulatory Visit (INDEPENDENT_AMBULATORY_CARE_PROVIDER_SITE_OTHER): Payer: Medicaid Other | Admitting: Physical Medicine and Rehabilitation

## 2019-12-28 VITALS — BP 132/78 | HR 58

## 2019-12-28 DIAGNOSIS — M5416 Radiculopathy, lumbar region: Secondary | ICD-10-CM

## 2019-12-28 MED ORDER — METHYLPREDNISOLONE ACETATE 80 MG/ML IJ SUSP
80.0000 mg | Freq: Once | INTRAMUSCULAR | Status: AC
  Administered 2019-12-28: 80 mg

## 2019-12-28 NOTE — Progress Notes (Signed)
Pt state lower back pain. Pt state blending and leaning over makes the pain worse. Pt state pain pills helps a little.  Numeric Pain Rating Scale and Functional Assessment Average Pain 8   In the last MONTH (on 0-10 scale) has pain interfered with the following?  1. General activity like being  able to carry out your everyday physical activities such as walking, climbing stairs, carrying groceries, or moving a chair?  Rating(5)   +Driver, -BT, -Dye Allergies.

## 2020-01-19 NOTE — Progress Notes (Signed)
Javier Avila - 64 y.o. male MRN [id number]  Date of birth: November 23, 1955  Office Visit Note: Visit Date: 12/28/2019 PCP: Allayne Stack, DO Referred by: Allayne Stack, DO  Subjective: Chief Complaint  Patient presents with  . Lower Back - Pain   HPI:  Javier Avila is a 64 y.o. male who comes in today at the request of Dr. Lavada Mesi for planned Left L5-S1 Lumbar epidural steroid injection with fluoroscopic guidance.  The patient has failed conservative care including home exercise, medications, time and activity modification.  This injection will be diagnostic and hopefully therapeutic.  Please see requesting physician notes for further details and justification.  Patient appears to have discogenic type low back pain degenerative changes with pain with forward flexion.  Could have some level of facet mediated pain.  Depending on relief suggest MRI of the lumbar spine.   ROS Otherwise per HPI.  Assessment & Plan: Visit Diagnoses:  1. Lumbar radiculopathy     Plan: No additional findings.   Meds & Orders:  Meds ordered this encounter  Medications  . methylPREDNISolone acetate (DEPO-MEDROL) injection 80 mg    Orders Placed This Encounter  Procedures  . XR C-ARM NO REPORT  . Epidural Steroid injection    Follow-up: Return for visit to requesting physician as needed.   Procedures: No procedures performed  Lumbar Epidural Steroid Injection - Interlaminar Approach with Fluoroscopic Guidance  Patient: Javier Avila      Date of Birth: 1955-11-25 MRN: [id number] PCP: Allayne Stack, DO      Visit Date: 12/28/2019   Universal Protocol:     Consent Given By: the patient  Position: PRONE  Additional Comments: Vital signs were monitored before and after the procedure. Patient was prepped and draped in the usual sterile fashion. The correct patient, procedure, and site was verified.   Injection Procedure Details:  Procedure Site One Meds Administered:  Meds  ordered this encounter  Medications  . methylPREDNISolone acetate (DEPO-MEDROL) injection 80 mg     Laterality: Left  Location/Site:  L5-S1  Needle size: 20 G  Needle type: Tuohy  Needle Placement: Paramedian epidural  Findings:   -Comments: Excellent flow of contrast into the epidural space.  Procedure Details: Using a paramedian approach from the side mentioned above, the region overlying the inferior lamina was localized under fluoroscopic visualization and the soft tissues overlying this structure were infiltrated with 4 ml. of 1% Lidocaine without Epinephrine. The Tuohy needle was inserted into the epidural space using a paramedian approach.   The epidural space was localized using loss of resistance along with lateral and bi-planar fluoroscopic views.  After negative aspirate for air, blood, and CSF, a 2 ml. volume of Isovue-250 was injected into the epidural space and the flow of contrast was observed. Radiographs were obtained for documentation purposes.    The injectate was administered into the level noted above.   Additional Comments:  The patient tolerated the procedure well Dressing: 2 x 2 sterile gauze and Band-Aid    Post-procedure details: Patient was observed during the procedure. Post-procedure instructions were reviewed.  Patient left the clinic in stable condition.     Clinical History: 12/14/2019 lumbar x-rays  X-rays lumbar spine reveal moderate degenerative disc disease at L4-5 and  L5-S1. Alignment is otherwise anatomic, no sign of neoplasm or  compression fracture. Hip joints have mild degenerative change. Lavada Mesi, MD     Objective:  VS:  HT:    WT:   BMI:  BP:132/78  HR:(!) 58bpm  TEMP: ( )  RESP:  Physical Exam Constitutional:      General: He is not in acute distress.    Appearance: Normal appearance. He is not ill-appearing.  HENT:     Head: Normocephalic and atraumatic.     Right Ear: External ear normal.     Left  Ear: External ear normal.  Eyes:     Extraocular Movements: Extraocular movements intact.  Cardiovascular:     Rate and Rhythm: Normal rate.     Pulses: Normal pulses.  Abdominal:     General: There is no distension.     Palpations: Abdomen is soft.  Musculoskeletal:        General: No tenderness or signs of injury.     Right lower leg: No edema.     Left lower leg: No edema.     Comments: Patient has good distal strength without clonus.  Skin:    Findings: No erythema or rash.  Neurological:     General: No focal deficit present.     Mental Status: He is alert and oriented to person, place, and time.     Sensory: No sensory deficit.     Motor: No weakness or abnormal muscle tone.     Coordination: Coordination normal.  Psychiatric:        Mood and Affect: Mood normal.        Behavior: Behavior normal.      Imaging: No results found.

## 2020-01-19 NOTE — Procedures (Signed)
Lumbar Epidural Steroid Injection - Interlaminar Approach with Fluoroscopic Guidance  Patient: Javier Avila      Date of Birth: 1955/07/03 MRN: [id number] PCP: Allayne Stack, DO      Visit Date: 12/28/2019   Universal Protocol:     Consent Given By: the patient  Position: PRONE  Additional Comments: Vital signs were monitored before and after the procedure. Patient was prepped and draped in the usual sterile fashion. The correct patient, procedure, and site was verified.   Injection Procedure Details:  Procedure Site One Meds Administered:  Meds ordered this encounter  Medications  . methylPREDNISolone acetate (DEPO-MEDROL) injection 80 mg     Laterality: Left  Location/Site:  L5-S1  Needle size: 20 G  Needle type: Tuohy  Needle Placement: Paramedian epidural  Findings:   -Comments: Excellent flow of contrast into the epidural space.  Procedure Details: Using a paramedian approach from the side mentioned above, the region overlying the inferior lamina was localized under fluoroscopic visualization and the soft tissues overlying this structure were infiltrated with 4 ml. of 1% Lidocaine without Epinephrine. The Tuohy needle was inserted into the epidural space using a paramedian approach.   The epidural space was localized using loss of resistance along with lateral and bi-planar fluoroscopic views.  After negative aspirate for air, blood, and CSF, a 2 ml. volume of Isovue-250 was injected into the epidural space and the flow of contrast was observed. Radiographs were obtained for documentation purposes.    The injectate was administered into the level noted above.   Additional Comments:  The patient tolerated the procedure well Dressing: 2 x 2 sterile gauze and Band-Aid    Post-procedure details: Patient was observed during the procedure. Post-procedure instructions were reviewed.  Patient left the clinic in stable condition.

## 2020-01-23 NOTE — Progress Notes (Signed)
Cardiology Office Note:    Date:  01/25/2020   ID:  Javier Avila, DOB [birth year], MRN [id number]  PCP:  Allayne Stack, DO  Cardiologist:  No primary care provider on file.   Referring MD: McDiarmid, Leighton Roach, MD   Chief Complaint  Patient presents with  . Coronary Artery Disease  . Hyperlipidemia    History of Present Illness:    Javier Avila is a 64 y.o. male with a hx of inferior MI, angina responsive to SL NTG, COPD, chronic back pain, and h/o CVA.  Vague history of heart disease.  Says he had an echocardiogram done while in [place] that showed a "heart attack".  Never had a coronary arteriogram or stents placed.  Current symptoms include chest tightness at times that is severe and relieved by nitroglycerin after 5 to 10 minutes.  At other times he has a feeling of gas building up in his chest that usually does not require nitroglycerin.  States his tightness occurs when he is on the emotional stress.  He walks nearly every place that he goes.  He works at the [employer] on [address]. and walks 15 minutes to work in 15 minutes back home without difficulty.  Everything he does requires walking which he performs.  He is not limited in the physical way by angina.  Smokes 1-1/2 packs of cigarettes per Javier for greater than 30 years.  Possibly a family history of CAD but does not know details concerning the family history.  Does not drink alcohol.  No history of hypertension.  Has hyperlipidemia and recently placed on Lipitor 40 mg/Javier.  Past Medical History:  Diagnosis Date  . Arthritis   . Falls infrequently 02/28/2018  . Heel pain, bilateral 12/06/2015  . Hypertension   . Lower back pain   . Stroke [facility])     History reviewed. No pertinent surgical history.  Current Medications: Current Meds  Medication Sig  . albuterol (PROVENTIL HFA;VENTOLIN HFA) 108 (90 Base) MCG/ACT inhaler Inhale 2 puffs into the lungs every 6 (six) hours as needed for wheezing or shortness of  breath.  Marland Kitchen atorvastatin (LIPITOR) 40 MG tablet Take 1 tablet (40 mg total) by mouth daily.  . baclofen (LIORESAL) 10 MG tablet Take 0.5-1 tablets (5-10 mg total) by mouth 3 (three) times daily as needed for muscle spasms.  . diclofenac sodium (VOLTAREN) 1 % GEL Apply 4 g topically 4 (four) times daily. (Patient taking differently: Apply 4 g topically daily. )  . isosorbide mononitrate (IMDUR) 30 MG 24 hr tablet TAKE 1 TABLET BY MOUTH EVERY Javier  . meloxicam (MOBIC) 15 MG tablet Take 0.5-1 tablets (7.5-15 mg total) by mouth daily as needed for pain.  . mometasone-formoterol (DULERA) 200-5 MCG/ACT AERO Inhale 2 puffs into the lungs 2 (two) times daily.  . nitroGLYCERIN (NITROSTAT) 0.4 MG SL tablet Place 1 tablet (0.4 mg total) under the tongue every 5 (five) minutes as needed for chest pain.     Allergies:   Patient has no known allergies.   Social History   Socioeconomic History  . Marital status: Single    Spouse name: Not on file  . Number of children: Not on file  . Years of education: Not on file  . Highest education level: Not on file  Occupational History  . Not on file  Tobacco Use  . Smoking status: Current Every Javier Smoker    Packs/Javier: 0.50    Years: 43.00    Pack years: 21.50  Types: Cigarettes    Start date: 04/30/1974  . Smokeless tobacco: Never Used  Vaping Use  . Vaping Use: Never used  Substance and Sexual Activity  . Alcohol use: No  . Drug use: Not Currently    Comment: About once monthly when in severe pain or needs to relax  . Sexual activity: Not Currently  Other Topics Concern  . Not on file  Social History Narrative  . Not on file   Social Determinants of Health   Financial Resource Strain:   . Difficulty of Paying Living Expenses: Not on file  Food Insecurity:   . Worried About Programme researcher, broadcasting/film/video in the Last Year: Not on file  . Ran Out of Food in the Last Year: Not on file  Transportation Needs:   . Lack of Transportation (Medical): Not on  file  . Lack of Transportation (Non-Medical): Not on file  Physical Activity:   . Days of Exercise per Week: Not on file  . Minutes of Exercise per Session: Not on file  Stress:   . Feeling of Stress : Not on file  Social Connections:   . Frequency of Communication with Friends and Family: Not on file  . Frequency of Social Gatherings with Friends and Family: Not on file  . Attends Religious Services: Not on file  . Active Member of Clubs or Organizations: Not on file  . Attends Banker Meetings: Not on file  . Marital Status: Not on file     Family History: The patient's Family history is unknown by patient.  ROS:   Please see the history of present illness.    He has chronic low back pain and left neck discomfort.  He gets some shortness of breath and wheezing possibly related to tobacco use.  He uses Dulera and Ventolin.  He is not interested in discontinuing cigarette smoking.  All other systems reviewed and are negative.  EKGs/Labs/Other Studies Reviewed:    The following studies were reviewed today: No current or recent cardiac evaluation.  EKG:  EKG  With no old tracings to compare..  Today's tracing demonstrates sinus bradycardia at 54 bpm.  Recent Labs: 11/30/2019: BUN 12; Creatinine, Ser 0.86; Potassium 4.3; Sodium 143  Recent Lipid Panel    Component Value Date/Time   CHOL 180 11/30/2019 1621   TRIG 132 11/30/2019 1621   HDL 34 (L) 11/30/2019 1621   CHOLHDL 5.3 (H) 11/30/2019 1621   CHOLHDL 4.8 12/06/2015 1109   VLDL 18 12/06/2015 1109   LDLCALC 122 (H) 11/30/2019 1621    Physical Exam:    VS:  BP 122/64   Pulse (!) 54   Ht 6\' 4"  (1.93 m)   Wt 212 lb (96.2 kg)   SpO2 97%   BMI 25.81 kg/m     Wt Readings from Last 3 Encounters:  01/25/20 212 lb (96.2 kg)  11/30/19 213 lb 3.2 oz (96.7 kg)  08/22/18 216 lb (98 kg)     GEN: Appears older than stated age.  Not obese. No acute distress HEENT: Normal NECK: No JVD. LYMPHATICS: No  lymphadenopathy CARDIAC:  RRR without murmur, gallop, or edema. VASCULAR: Normal Pulses. No bruits.  2+ bilateral posterior tibial pulses. RESPIRATORY:  Clear to auscultation without rales, wheezing or rhonchi  ABDOMEN: Soft, non-tender, non-distended, No pulsatile mass, MUSCULOSKELETAL: No deformity  SKIN: Warm and dry NEUROLOGIC:  Alert and oriented x 3 PSYCHIATRIC:  Normal affect   ASSESSMENT:    1. Angina pectoris (HCC)  2. Hyperlipidemia, unspecified hyperlipidemia type   3. COPD GOLD 0/ still smoking    4. Cigarette smoker   5. Pre-diabetes   6. Educated about COVID-19 virus infection   7. Other chest pain    PLAN:    In order of problems listed above:  1. Angina pectoris responsive to nitroglycerin.  Anginal complaints are usually at rest and related to stress.  States his history of coronary disease and prior myocardial infarction was based upon an echocardiographic interpretation.  Never had coronary angiography.  Plan stress myocardial perfusion imaging to get some sense of risk.  Needs to be on aspirin 81 mg/Javier if not allergic. 2. LDL target less than 70.  Continue high intensity statin therapy and if LDL of 70 is not achieved, further increase the medication dose. 3. I encouraged to stop smoking but this is not really anything to interest the patient. 4. Hemoglobin A1c was 5.5 recently. 5. He is not vaccinated.  Encourage COVID-19 vaccination.  Overall education and awareness concerning primary/secondary risk prevention was discussed in detail: LDL less than 70, hemoglobin A1c less than 7, blood pressure target less than 130/80 mmHg, >150 minutes of moderate aerobic activity per week, avoidance of smoking, weight control (via diet and exercise), and continued surveillance/management of/for obstructive sleep apnea.  He should also use 81 mg of aspirin daily.    Medication Adjustments/Labs and Tests Ordered: Current medicines are reviewed at length with the patient  today.  Concerns regarding medicines are outlined above.  Orders Placed This Encounter  Procedures  . MYOCARDIAL PERFUSION IMAGING  . EKG 12-Lead   No orders of the defined types were placed in this encounter.   Patient Instructions  Medication Instructions:  Your physician recommends that you continue on your current medications as directed. Please refer to the Current Medication list given to you today.  *If you need a refill on your cardiac medications before your next appointment, please call your pharmacy*   Lab Work: None If you have labs (blood work) drawn today and your tests are completely normal, you will receive your results only by: Marland Kitchen. MyChart Message (if you have MyChart) OR . A paper copy in the mail If you have any lab test that is abnormal or we need to change your treatment, we will call you to review the results.   Testing/Procedures: Your physician has requested that you have en exercise stress myoview. For further information please visit [email or web address]. Please follow instruction sheet, as given.    Follow-Up: At [facility], you and your health needs are our priority.  As part of our continuing mission to provide you with exceptional heart care, we have created designated Provider Care Teams.  These Care Teams include your primary Cardiologist (physician) and Advanced Practice Providers (APPs -  Physician Assistants and Nurse Practitioners) who all work together to provide you with the care you need, when you need it.  We recommend signing up for the patient portal called "MyChart".  Sign up information is provided on this After Visit Summary.  MyChart is used to connect with patients for Virtual Visits (Telemedicine).  Patients are able to view lab/test results, encounter notes, upcoming appointments, etc.  Non-urgent messages can be sent to your provider as well.   To learn more about what you can do with MyChart, go to [email or web address].    Your  next appointment:   12 month(s)  The format for your next appointment:   In Person  Provider:   You  may see Dr. Verdis Prime or one of the following Advanced Practice Providers on your designated Care Team:    Norma Fredrickson, NP  Nada Boozer, NP  Georgie Chard, NP    Other Instructions      Signed, Lesleigh Noe, MD  01/25/2020 1:51 PM    [facility]

## 2020-01-25 ENCOUNTER — Ambulatory Visit (INDEPENDENT_AMBULATORY_CARE_PROVIDER_SITE_OTHER): Payer: Medicaid Other | Admitting: Interventional Cardiology

## 2020-01-25 ENCOUNTER — Other Ambulatory Visit: Payer: Self-pay

## 2020-01-25 ENCOUNTER — Encounter: Payer: Self-pay | Admitting: Interventional Cardiology

## 2020-01-25 ENCOUNTER — Encounter: Payer: Self-pay | Admitting: *Deleted

## 2020-01-25 VITALS — BP 122/64 | HR 54 | Ht 76.0 in | Wt 212.0 lb

## 2020-01-25 DIAGNOSIS — R0789 Other chest pain: Secondary | ICD-10-CM | POA: Diagnosis not present

## 2020-01-25 DIAGNOSIS — E785 Hyperlipidemia, unspecified: Secondary | ICD-10-CM | POA: Diagnosis not present

## 2020-01-25 DIAGNOSIS — R7303 Prediabetes: Secondary | ICD-10-CM

## 2020-01-25 DIAGNOSIS — J449 Chronic obstructive pulmonary disease, unspecified: Secondary | ICD-10-CM

## 2020-01-25 DIAGNOSIS — I209 Angina pectoris, unspecified: Secondary | ICD-10-CM | POA: Diagnosis not present

## 2020-01-25 DIAGNOSIS — Z7189 Other specified counseling: Secondary | ICD-10-CM

## 2020-01-25 DIAGNOSIS — F17210 Nicotine dependence, cigarettes, uncomplicated: Secondary | ICD-10-CM

## 2020-01-25 NOTE — Patient Instructions (Signed)
Medication Instructions:  Your physician recommends that you continue on your current medications as directed. Please refer to the Current Medication list given to you today.  *If you need a refill on your cardiac medications before your next appointment, please call your pharmacy*   Lab Work: None If you have labs (blood work) drawn today and your tests are completely normal, you will receive your results only by: Marland Kitchen MyChart Message (if you have MyChart) OR . A paper copy in the mail If you have any lab test that is abnormal or we need to change your treatment, we will call you to review the results.   Testing/Procedures: Your physician has requested that you have en exercise stress myoview. For further information please visit [email or web address] Please follow instruction sheet, as given.    Follow-Up: At [facility], you and your health needs are our priority.  As part of our continuing mission to provide you with exceptional heart care, we have created designated Provider Care Teams.  These Care Teams include your primary Cardiologist (physician) and Advanced Practice Providers (APPs -  Physician Assistants and Nurse Practitioners) who all work together to provide you with the care you need, when you need it.  We recommend signing up for the patient portal called "MyChart".  Sign up information is provided on this After Visit Summary.  MyChart is used to connect with patients for Virtual Visits (Telemedicine).  Patients are able to view lab/test results, encounter notes, upcoming appointments, etc.  Non-urgent messages can be sent to your provider as well.   To learn more about what you can do with MyChart, go to [email or web address].    Your next appointment:   12 month(s)  The format for your next appointment:   In Person  Provider:   You may see Dr. Verdis Prime or one of the following Advanced Practice Providers on your designated Care Team:    Norma Fredrickson,  NP  Nada Boozer, NP  Georgie Chard, NP    Other Instructions

## 2020-02-03 ENCOUNTER — Telehealth (HOSPITAL_COMMUNITY): Payer: Self-pay

## 2020-02-03 NOTE — Telephone Encounter (Signed)
Detailed instructions left on the patient's answering machine. Asked to call back with any questions. S.Nuria Phebus EMTP 

## 2020-02-05 ENCOUNTER — Other Ambulatory Visit (HOSPITAL_COMMUNITY): Payer: Medicaid Other

## 2020-02-09 ENCOUNTER — Encounter (HOSPITAL_COMMUNITY): Payer: Self-pay

## 2020-02-09 ENCOUNTER — Encounter (HOSPITAL_COMMUNITY): Payer: Medicaid Other

## 2020-02-09 ENCOUNTER — Encounter (HOSPITAL_COMMUNITY): Payer: Self-pay | Admitting: Interventional Cardiology

## 2020-02-20 ENCOUNTER — Other Ambulatory Visit: Payer: Self-pay | Admitting: Family Medicine

## 2020-02-20 DIAGNOSIS — I209 Angina pectoris, unspecified: Secondary | ICD-10-CM

## 2020-02-22 ENCOUNTER — Telehealth (HOSPITAL_COMMUNITY): Payer: Self-pay | Admitting: Interventional Cardiology

## 2020-02-22 NOTE — Telephone Encounter (Signed)
Just an FYI. We have made several attempts to contact this patient including sending a letter to schedule or reschedule their Myoview. We will be removing the patient from the [facility].  02/09/20 NO SHOW- MAILED LETTER [facility]    Thank you

## 2020-02-22 NOTE — Telephone Encounter (Signed)
Thanks for the update

## 2020-08-19 DIAGNOSIS — M79603 Pain in arm, unspecified: Secondary | ICD-10-CM | POA: Diagnosis not present

## 2020-12-12 ENCOUNTER — Other Ambulatory Visit: Payer: Self-pay

## 2020-12-12 ENCOUNTER — Encounter: Payer: Self-pay | Admitting: Family Medicine

## 2020-12-12 ENCOUNTER — Ambulatory Visit: Payer: Medicaid Other | Admitting: Family Medicine

## 2020-12-12 VITALS — BP 120/72 | HR 67 | Ht 76.0 in | Wt 219.6 lb

## 2020-12-12 DIAGNOSIS — M545 Low back pain, unspecified: Secondary | ICD-10-CM

## 2020-12-12 DIAGNOSIS — Z23 Encounter for immunization: Secondary | ICD-10-CM | POA: Diagnosis not present

## 2020-12-12 DIAGNOSIS — R0609 Other forms of dyspnea: Secondary | ICD-10-CM

## 2020-12-12 DIAGNOSIS — I209 Angina pectoris, unspecified: Secondary | ICD-10-CM

## 2020-12-12 DIAGNOSIS — R0600 Dyspnea, unspecified: Secondary | ICD-10-CM

## 2020-12-12 DIAGNOSIS — G8929 Other chronic pain: Secondary | ICD-10-CM

## 2020-12-12 DIAGNOSIS — J449 Chronic obstructive pulmonary disease, unspecified: Secondary | ICD-10-CM

## 2020-12-12 DIAGNOSIS — F17210 Nicotine dependence, cigarettes, uncomplicated: Secondary | ICD-10-CM

## 2020-12-12 DIAGNOSIS — R296 Repeated falls: Secondary | ICD-10-CM | POA: Insufficient documentation

## 2020-12-12 MED ORDER — NITROGLYCERIN 0.4 MG SL SUBL: 0.4000 mg | SUBLINGUAL_TABLET | SUBLINGUAL | 0 refills | Status: AC | PRN

## 2020-12-12 MED ORDER — BACLOFEN 10 MG PO TABS: 5.0000 mg | ORAL_TABLET | Freq: Three times a day (TID) | ORAL | 3 refills | Status: AC | PRN

## 2020-12-12 MED ORDER — ATORVASTATIN CALCIUM 40 MG PO TABS: 40.0000 mg | ORAL_TABLET | Freq: Every day | ORAL | 3 refills | Status: DC

## 2020-12-12 MED ORDER — DULERA 200-5 MCG/ACT IN AERO: 2.0000 | INHALATION_SPRAY | Freq: Two times a day (BID) | RESPIRATORY_TRACT | 5 refills | Status: DC

## 2020-12-12 NOTE — Patient Instructions (Addendum)
Someone should call about the physical therapy referral I switched you to a more potent inhaler. I did the refills. I will document your disability in my notes. See Dr. Marisue Humble for a checkup in the next month or so at your convenience. With your breathing problems, smoking cessation is key.

## 2020-12-13 ENCOUNTER — Encounter: Payer: Self-pay | Admitting: Family Medicine

## 2020-12-13 NOTE — Progress Notes (Signed)
    SUBJECTIVE:   CHIEF COMPLAINT / HPI:   Chronic back pain, recent fall. Patient's first visit to [facility] in one year presents with fall ~10 days ago.  He has chronic back pain which led to him being on SS disability since 2004.  He states that the back pain is now affecting both legs Right>Left.  He states that he will turn in a certain way, an electric pain will grab his back and leg and he will fall.  In the most recent fall, he hurt his right knee, right hip/buttocks and low back.  He did not seek medical care and has not had any x rays. He also brings in forms from [employer] for him to complete annually about his ongoing disability.  There is no information for physicians to complete on the forms.  I did help him fill out a portion which was confusing for him.   He need reills on his nitro, dulera, baclofen and atorvastatin.  Acutally, he had a pulmacort.  He complains of worsening shortness of breath and wants a stronger inhaler.  He does continue to smoke. He has not yet me his new PCP.   He is behind on several HPDP interventions.     OBJECTIVE:   BP 120/72   Pulse 67   Ht 6\' 4"  (1.93 m)   Wt 219 lb 9.6 oz (99.6 kg)   SpO2 95%   BMI 26.73 kg/m   Lungs clear Cardiac RRR without m or g Abd benign Low back, spasm and loss of lordosis Hips, no bony tenderness.  No pain on internal or external rotation Right knee, no bony tenderness, no effusion.  Ligaments intact to provocative testing. Neuro, antalgic gait, very stiff.  Seems to lose balance easily   ASSESSMENT/PLAN:   Chronic lower back pain Worsened by recent fall.  Doubt any bony injury,  No x rays ordered.  He agrees to physical therapy.  Frequent falls Ordered PT.    Cigarette smoker Advised to quit.  Advised to FU with new PCP to discuss this and other health maint issues.  COPD GOLD 0/ still smoking  Filled dulera, DC pulmacort.  FU with PCP. Prevnar 20 vaccine today.     , MD [facility]

## 2020-12-13 NOTE — Assessment & Plan Note (Signed)
Advised to quit.  Advised to FU with new PCP to discuss this and other health maint issues.

## 2020-12-13 NOTE — Assessment & Plan Note (Signed)
Worsened by recent fall.  Doubt any bony injury,  No x rays ordered.  He agrees to physical therapy.

## 2020-12-13 NOTE — Assessment & Plan Note (Signed)
Ordered PT

## 2020-12-13 NOTE — Assessment & Plan Note (Addendum)
Filled dulera, DC pulmacort.  FU with PCP. Prevnar 20 vaccine today.

## 2021-02-07 NOTE — Progress Notes (Signed)
    SUBJECTIVE:   CHIEF COMPLAINT / HPI:   Back pain: 65 year old male presenting for back pain.  He was recently seen on 8/15 a fall about 10 days prior.  Per documentation it was like he has been on Social Security disability since 2004 due to this and that the pain affects both legs with right greater than left.  Patient was recommended to start physical therapy.  He states he continues to have back pain. Denies further falls.  Denies red flag symptoms, loss of bowel or bladder function, saddle paresthesias. He was referred to physical therapy but never heard from them. He got a back injection a few months ago that he states gave him a lot of benefit but he can't remember the name of the physician or office.  PERTINENT  PMH / PSH: Chronic back pain   OBJECTIVE:   BP 126/79   Pulse 70   Ht 6\' 4"  (1.93 m)   Wt 216 lb 9.6 oz (98.2 kg)   SpO2 96%   BMI 26.37 kg/m    General: NAD, pleasant, able to participate in exam Cardiac: RRR, no murmurs. Respiratory: CTAB, normal effort, No wheezes, rales or rhonchi Extremities: no edema or cyanosis. MSK: Some mild discomfort in the midline primarily with discomfort in the muscle lateral to the midline in the lumbar region on the left side.  Patient does have some discomfort rating down his leg on both the left and right when extending the leg with the chin tucked. Neuro: alert, no obvious focal deficits Psych: Normal affect and mood  ASSESSMENT/PLAN:   Chronic lower back pain No recent falls.  Patient with no change in the caliber of his back pain.  Denies red flag symptoms.  He never heard from physical therapy after the last appointment so I will place the referral again.  He is also inquiring about the previous physician in location he went to get back injections as these seem to give him good benefit.  I provided these to him.  He asked about using any kind of cream over the muscle of the low back and I recommended that he could try some  Voltaren/diclofenac gel as this may give him some benefit.  Follow-up as needed.     , DO [facility]

## 2021-02-09 ENCOUNTER — Ambulatory Visit (INDEPENDENT_AMBULATORY_CARE_PROVIDER_SITE_OTHER): Payer: Medicaid Other | Admitting: Family Medicine

## 2021-02-09 ENCOUNTER — Other Ambulatory Visit: Payer: Self-pay

## 2021-02-09 VITALS — BP 126/79 | HR 70 | Ht 76.0 in | Wt 216.6 lb

## 2021-02-09 DIAGNOSIS — M545 Low back pain, unspecified: Secondary | ICD-10-CM

## 2021-02-09 DIAGNOSIS — G8929 Other chronic pain: Secondary | ICD-10-CM | POA: Diagnosis not present

## 2021-02-09 NOTE — Assessment & Plan Note (Signed)
No recent falls.  Patient with no change in the caliber of his back pain.  Denies red flag symptoms.  He never heard from physical therapy after the last appointment so I will place the referral again.  He is also inquiring about the previous physician in location he went to get back injections as these seem to give him good benefit.  I provided these to him.  He asked about using any kind of cream over the muscle of the low back and I recommended that he could try some Voltaren/diclofenac gel as this may give him some benefit.  Follow-up as needed.

## 2021-02-09 NOTE — Patient Instructions (Addendum)
We saw you today for back pain.  You previously saw Dr. Alvester Morin for the injection. Their office number is [phone].  You could consider some voltaren gel also called diclofenac gel on your back.

## 2021-02-10 ENCOUNTER — Telehealth: Payer: Self-pay | Admitting: Physical Medicine and Rehabilitation

## 2021-02-10 DIAGNOSIS — M5442 Lumbago with sciatica, left side: Secondary | ICD-10-CM

## 2021-02-10 DIAGNOSIS — G8929 Other chronic pain: Secondary | ICD-10-CM

## 2021-02-10 DIAGNOSIS — M5416 Radiculopathy, lumbar region: Secondary | ICD-10-CM

## 2021-02-10 NOTE — Telephone Encounter (Signed)
Pt called requesting a call back to set an appt for back injection. Please call pt at [phone].

## 2021-02-13 NOTE — Telephone Encounter (Signed)
Left L5-S1 IL 12/28/2019. Ok to repeat if helped, same problem/side, and no new injury?

## 2021-02-13 NOTE — Telephone Encounter (Signed)
MRI ordered and patient notified.

## 2021-02-17 ENCOUNTER — Other Ambulatory Visit: Payer: Self-pay

## 2021-02-17 ENCOUNTER — Emergency Department (HOSPITAL_COMMUNITY)
Admission: EM | Admit: 2021-02-17 | Discharge: 2021-02-17 | Disposition: A | Discharge status: Home / Self Care | Payer: Medicaid Other | Attending: Emergency Medicine | Admitting: Emergency Medicine

## 2021-02-17 DIAGNOSIS — S299XXA Unspecified injury of thorax, initial encounter: Secondary | ICD-10-CM | POA: Diagnosis present

## 2021-02-17 DIAGNOSIS — S29012A Strain of muscle and tendon of back wall of thorax, initial encounter: Secondary | ICD-10-CM | POA: Diagnosis not present

## 2021-02-17 DIAGNOSIS — Y92007 Garden or yard of unspecified non-institutional (private) residence as the place of occurrence of the external cause: Secondary | ICD-10-CM | POA: Diagnosis not present

## 2021-02-17 DIAGNOSIS — Y9389 Activity, other specified: Secondary | ICD-10-CM | POA: Insufficient documentation

## 2021-02-17 DIAGNOSIS — X58XXXA Exposure to other specified factors, initial encounter: Secondary | ICD-10-CM | POA: Insufficient documentation

## 2021-02-17 DIAGNOSIS — S39012A Strain of muscle, fascia and tendon of lower back, initial encounter: Secondary | ICD-10-CM

## 2021-02-17 DIAGNOSIS — Z79899 Other long term (current) drug therapy: Secondary | ICD-10-CM | POA: Diagnosis not present

## 2021-02-17 DIAGNOSIS — R0902 Hypoxemia: Secondary | ICD-10-CM | POA: Diagnosis not present

## 2021-02-17 DIAGNOSIS — F17210 Nicotine dependence, cigarettes, uncomplicated: Secondary | ICD-10-CM | POA: Insufficient documentation

## 2021-02-17 DIAGNOSIS — J449 Chronic obstructive pulmonary disease, unspecified: Secondary | ICD-10-CM | POA: Diagnosis not present

## 2021-02-17 DIAGNOSIS — I10 Essential (primary) hypertension: Secondary | ICD-10-CM | POA: Insufficient documentation

## 2021-02-17 DIAGNOSIS — M549 Dorsalgia, unspecified: Secondary | ICD-10-CM | POA: Diagnosis not present

## 2021-02-17 MED ORDER — NAPROXEN 500 MG PO TABS: 500.0000 mg | ORAL_TABLET | Freq: Two times a day (BID) | ORAL | 0 refills | Status: DC

## 2021-02-17 MED ORDER — METHOCARBAMOL 500 MG PO TABS: 500.0000 mg | ORAL_TABLET | Freq: Two times a day (BID) | ORAL | 0 refills | Status: DC | PRN

## 2021-02-17 MED ORDER — DEXAMETHASONE SODIUM PHOSPHATE 10 MG/ML IJ SOLN
10.0000 mg | Freq: Once | INTRAMUSCULAR | Status: AC
  Administered 2021-02-17: 10 mg via INTRAMUSCULAR
  Filled 2021-02-17: qty 1

## 2021-02-17 NOTE — ED Triage Notes (Signed)
Pt reports L sided back pain since working in the yard and picking up sticks last weekend. Pain radiates to leg with movements. Taking advil and icy hot patches without relief.

## 2021-02-17 NOTE — ED Provider Notes (Addendum)
Emergency Medicine Provider Triage Evaluation Note  Javier Avila , a 65 y.o. male  was evaluated in triage.  Pt complains of left sided back pain for one week. He reports that it started after he was picking up sticks last weekend.  No new numbness or weakness.  No changes to bowel or bladder function.  No dysuria.   Pain is worse with movement and touching it. Pain sometimes goes down left leg.   Pain is mostly in lower back but spreads up his back on the left side.   Review of Systems  Positive: Back pain Negative: Chest pain, syncope, weakness  Physical Exam  BP 134/90 (BP Location: Right Arm)   Pulse 67   Temp 98.6 F (37 C) (Oral)   Resp 16   SpO2 92%  Gen:   Awake, no distress   Resp:  Normal effort  MSK:   Moves extremities without difficulty  Other:  TTP over right sided back diffusely.   Medical Decision Making  Medically screening exam initiated at 7:05 PM.  Appropriate orders placed.  Javier Avila was informed that the remainder of the evaluation will be completed by another provider, this initial triage assessment does not replace that evaluation, and the importance of remaining in the ED until their evaluation is complete.  Note: Portions of this report may have been transcribed using voice recognition software. Every effort was made to ensure accuracy; however, inadvertent computerized transcription errors may be present   97% on room air.    Cristina Gong, PA-C 02/17/21 1910    Cristina Gong, PA-C 02/17/21 Darnell Level    Eber Hong, MD 02/17/21 2142

## 2021-02-17 NOTE — ED Provider Notes (Signed)
[name] EMERGENCY DEPARTMENT Provider Note   CSN: [id number] Arrival date & time: 02/17/21  1720     History Chief Complaint  Patient presents with   Back Pain    Legend Javier Avila is a 65 y.o. male.   Back Pain Associated symptoms: no numbness and no weakness    This patient is a 65 year old male, he has a prior history of chronic low back pain as well as some upper neck pain on the left side.  He has hypertension.  He follows with the family practice at [facility].  He reports that he has had some ongoing chronic back pain over time which occasionally goes into his legs but the reason he is here today is because he has some pain in the mid back, it is on the left side of the spine, it occurred about a week ago while he was trying to pick up branches on the ground in his yard prior to mowing the yard.  He states that it is much worse when he tries to move that muscle when he tries to change position or when he palpates that area, it is not associated with coughing or shortness of breath though taking a deep breath does often make the pain worse.  No rashes in that area, no fevers, it does not radiate down his arms and there is no numbness or weakness in the arms.  He has had ibuprofen and Advil without relief.  Past Medical History:  Diagnosis Date   Arthritis    Falls infrequently 02/28/2018   Heel pain, bilateral 12/06/2015   Hypertension    Lower back pain    Stroke [facility])     Patient Active Problem List   Diagnosis Date Noted   Frequent falls 12/12/2020   Acute lumbar back pain 12/10/2018   Angina pectoris (HCC) 12/10/2018   Cigarette smoker 08/23/2018   COPD GOLD 0/ still smoking  08/22/2018   Dyspnea on exertion 01/27/2018   Health care maintenance 12/05/2017   Hyperlipidemia 12/06/2015   History of hypertension 12/06/2015   Pre-diabetes 12/06/2015   Chronic lower back pain 12/06/2015    No past surgical history on file.     Family  History  Family history unknown: Yes    Social History   Tobacco Use   Smoking status: Every Day    Packs/day: 0.50    Years: 43.00    Pack years: 21.50    Types: Cigarettes    Start date: 04/30/1974   Smokeless tobacco: Never  Vaping Use   Vaping Use: Never used  Substance Use Topics   Alcohol use: No   Drug use: Not Currently    Comment: About once monthly when in severe pain or needs to relax    Home Medications Prior to Admission medications   Medication Sig Start Date End Date Taking? Authorizing Provider  methocarbamol (ROBAXIN) 500 MG tablet Take 1 tablet (500 mg total) by mouth 2 (two) times daily as needed for muscle spasms. 02/17/21  Yes Eber Hong, MD  naproxen (NAPROSYN) 500 MG tablet Take 1 tablet (500 mg total) by mouth 2 (two) times daily with a meal. 02/17/21  Yes Eber Hong, MD  albuterol (PROVENTIL HFA;VENTOLIN HFA) 108 (90 Base) MCG/ACT inhaler Inhale 2 puffs into the lungs every 6 (six) hours as needed for wheezing or shortness of breath. 01/27/18   Allayne Stack, DO  atorvastatin (LIPITOR) 40 MG tablet Take 1 tablet (40 mg total) by mouth daily.  12/12/20   [name] Manners, MD  baclofen (LIORESAL) 10 MG tablet Take 0.5-1 tablets (5-10 mg total) by mouth 3 (three) times daily as needed for muscle spasms. 12/12/20   [name] Manners, MD  diclofenac sodium (VOLTAREN) 1 % GEL Apply 4 g topically 4 (four) times daily. Patient taking differently: Apply 4 g topically daily.  11/29/17   Myrene Buddy, MD  isosorbide mononitrate (IMDUR) 30 MG 24 hr tablet TAKE 1 TABLET BY MOUTH EVERY DAY 12/22/19   Allayne Stack, DO  meloxicam (MOBIC) 15 MG tablet Take 0.5-1 tablets (7.5-15 mg total) by mouth daily as needed for pain. 12/14/19   Hilts, Casimiro Needle, MD  mometasone-formoterol (DULERA) 200-5 MCG/ACT AERO Inhale 2 puffs into the lungs 2 (two) times daily. 12/12/20   [name] Manners, MD  nitroGLYCERIN (NITROSTAT) 0.4 MG SL tablet Place 1 tablet (0.4 mg total) under the  tongue every 5 (five) minutes as needed for chest pain. 12/12/20   [name] Manners, MD    Allergies    Patient has no known allergies.  Review of Systems   Review of Systems  Musculoskeletal:  Positive for back pain.  Neurological:  Negative for weakness and numbness.   Physical Exam Updated Vital Signs BP 134/90 (BP Location: Right Arm)   Pulse 67   Temp 98.6 F (37 C) (Oral)   Resp 16   SpO2 92%   Physical Exam Vitals and nursing note reviewed.  Constitutional:      Appearance: He is well-developed. He is not diaphoretic.  HENT:     Head: Normocephalic and atraumatic.  Eyes:     General:        Right eye: No discharge.        Left eye: No discharge.     Conjunctiva/sclera: Conjunctivae normal.  Pulmonary:     Effort: Pulmonary effort is normal. No respiratory distress.  Musculoskeletal:        General: Tenderness present. No deformity.     Right lower leg: No edema.     Left lower leg: No edema.     Comments: There is no tenderness over the cervical thoracic or lumbar spine affect there is no paraspinal muscles except for the mid back around T8-T9 where there is some tenderness in the left posterior thorax, there is no rash in this area, he is exquisitely tender to touch over that 1 focal spot, it is also made worse with movement of his thorax and a rotational or flexion type position.  Skin:    General: Skin is warm and dry.     Findings: No erythema or rash.  Neurological:     Mental Status: He is alert.     Coordination: Coordination normal.     Comments: Normal gait normal strength normal sensation normal mental status.    ED Results / Procedures / Treatments   Labs (all labs ordered are listed, but only abnormal results are displayed) Labs Reviewed - No data to display  EKG None  Radiology No results found.  Procedures Procedures   Medications Ordered in ED Medications  dexamethasone (DECADRON) injection 10 mg (has no administration in time  range)    ED Course  I have reviewed the triage vital signs and the nursing notes.  Pertinent labs & imaging results that were available during my care of the patient were reviewed by me and considered in my medical decision making (see chart for details).    MDM Rules/Calculators/A&P  The patient has a normal neurologic exam, reproducible tenderness over the muscle group involved, likely muscular strain and spasm, home with Robaxin and Naprosyn, given Decadron prior to discharge, stable  Final Clinical Impression(s) / ED Diagnoses Final diagnoses:  Back strain, initial encounter    Rx / DC Orders ED Discharge Orders          Ordered    methocarbamol (ROBAXIN) 500 MG tablet  2 times daily PRN        02/17/21 2141    naproxen (NAPROSYN) 500 MG tablet  2 times daily with meals        02/17/21 2141             Eber Hong, MD 02/17/21 2144

## 2021-02-17 NOTE — Discharge Instructions (Signed)
Back Pain:   Your back pain should be treated with medicines such as ibuprofen or aleve and this back pain should get better over the next 2 weeks.  However if you develop severe or worsening pain, low back pain with fever, numbness, weakness or inability to walk or urinate, you should return to the ER immediately.  Please follow up with your doctor this week for a recheck if still having symptoms. Low back pain is discomfort in the lower back that may be due to injuries to muscles and ligaments around the spine.  Occasionally, it may be caused by a a problem to a part of the spine called a disc.  The pain may last several days or a week;  However, most patients get completely well in 4 weeks.  Self - care:  The application of heat can help soothe the pain.  Maintaining your daily activities, including walking, is encourged, as it will help you get better faster than just staying in bed.  Medications are also useful to help with pain control.  A commonly prescribed medications includes acetaminophen.  This medication is generally safe, though you should not take more than 8 of the extra strength ('500mg'$ ) pills a day.  Non steroidal anti inflammatory medications including Ibuprofen and naproxen;  These medications help both pain and swelling and are very useful in treating back pain.  They should be taken with food, as they can cause stomach upset, and more seriously, stomach bleeding.    Muscle relaxants:  These medications can help with muscle tightness that is a cause of lower back pain.  Most of these medications can cause drowsiness, and it is not safe to drive or use dangerous machinery while taking them.  You will need to follow up with  Your primary healthcare provider in 1-2 weeks for reassessment.  Be aware that if you develop new symptoms, such as a fever, leg weakness, difficulty with or loss of control of your urine or bowels, abdominal pain, or more severe pain, you will need to seek  medical attention and  / or return to the Emergency department.  If you do not have a doctor see the list below.  RESOURCE GUIDE  Chronic Pain Problems: Contact [facility]  [phone] Patients need to be referred by their primary care doctor.  Insufficient Money for Medicine: Contact [employer] Way:  call "211" or [place] [phone].  No Primary Care Doctor: Call [facility]  [phone] - can help you locate a primary care doctor that  accepts your insurance, provides certain services, etc. Physician Referral [phone]  Agencies that provide inexpensive medical care: [facility]  [facility]  [phone] [facility]  [phone] [facility]  [phone] Planned Parenthood  [phone] [facility]  [phone]  [place] Providers: [facility]- [address], [address]  [phone], Mon-Fri 9am-7pm, Sat 9am-1pm [place], [address] [place]  [place], [address] [place]  [place]- [address]  [place], [address], [phone]  Only accepts [place] patients after they have their name  applied to their card  Self Pay (no insurance) in [facility]: Sickle Cell Patients: Dr Kevan Ny, [facility]  [place], [facility]- [address]  [facility]- [address] [place], [facility]- see information above (Speak to [employer] if you do not have insurance)       -  [facility]- [place], [place],  [place], [place]  Dr Vista Lawman-   [address], [address], [place], [facility]- [address] [phone]       -  [facility] [employer]- [address] [place], [place], also [address] [phone]       -    [facility]- [address], [place], 1st & 3rd Saturday   every month, 10am-1pm  1) Find a Doctor and Pay Out of Pocket Although you won't have to find out who is covered by your insurance plan, it is a good idea to ask around and get recommendations. You will then need to call the office and see if the doctor you have chosen will accept you as a new patient and what types of options they offer for patients who are self-pay. Some doctors offer discounts or will set up payment plans for their patients who do not have insurance, but you will need to ask so you aren't surprised when you get to your appointment.  2) Contact Your Local Health Department Not all health departments have doctors that can see patients for sick visits, but many do, so it is worth a call to see if yours does. If you don't know where your local health department is, you can check in your phone book. The CDC also has a tool to help you locate your state's health department, and many state websites also have listings of all of their local health departments.  3) Find a [facility] If your illness is not likely to be very severe or complicated, you may want to try a walk in clinic. These are popping up all over the country in pharmacies, drugstores, and shopping centers. They're usually staffed by nurse practitioners or physician assistants that have been trained to treat common illnesses and complaints. They're usually fairly quick and inexpensive. However, if you have serious medical issues or chronic medical problems, these are probably not your best option  [facility] [place], [facility], [address], [place], phone [phone] or  [phone].  Monday - Friday, call for an appointment. [place], [facility], [address], [place], phone [phone] or [phone].  Monday - Friday, call for an appointment.  Abuse/Neglect: [place] [phone] [place] [phone] (After Hours)  Emergency Shelter:  [facility] [phone]  Maternity Homes: Room at the [place] [phone] [place] [phone]  MRSA Hotline #:   506 233 9464  [facility] [place] Dept. [address]         [address] [place] [address]  Blondell Reveal Phone:  [phone]                                  Phone:  [phone]                   Phone:  [phone]  [facility], [phone] [facility] [place]- [phone]       -     [facility] in [place], [address]                                  [phone], [facility] Child Abuse Hotline 405 464 0550 or [phone] (After Hours)   Behavioral Health Services  Substance Abuse Resources: [facility] and Drug Services  [phone] [facility] [phone] [facility] [place] [phone] Floydene Flock [phone] Residential & Outpatient Substance Abuse Program  [phone]  Psychological Services: [facility]  [phone] [facility]  [phone] [facility], [phone] [place]. [address], [place], ACCESS LINE: [phone] or [phone], [email or web address]  Dental Assistance  If unable to pay or uninsured, contact:  [facility] or [facility]. to  become qualified for the adult dental clinic.  Patients with Medicaid: [facility] [phone] [address], [phone] [address], [phone]  If unable to pay, or uninsured, contact [facility] [phone]) or [facility] Department [phone] in [place], [phone] in [facility]) to become qualified for the adult dental clinic  Other Low-Cost Community Dental Services: [facility]- [address] [place], [place], [place], [address], [phone], [phone], 2nd and 4th Thursday of the month at 6:30am.  10 clients each day by appointment, can sometimes see walk-in patients if someone does not show for an appointment. [facility]- [address] [place], [place], [address], [phone] [facility] [address] [place], [place], [address], [phone] [facility] Department- [phone] [facility] Health Department- [phone] [facility] [phone]    Please take Robaxin, 500 mg up to twice a day as needed for muscle spasm, this is a muscle relaxer, it may cause generalized weakness, sleepiness and you should not drive or do important things while taking this medication.  Please take Naprosyn, 500mg  by mouth twice daily as needed for pain - this in an antiinflammatory medicine (NSAID) and is similar to ibuprofen - many people feel that it is stronger than ibuprofen and it is easier to take since it is a smaller pill.  Please use this only for 1 week - if your pain persists, you will need to follow up with your doctor in the office for ongoing guidance and pain control.

## 2021-02-18 ENCOUNTER — Telehealth: Payer: Self-pay

## 2021-02-18 NOTE — Telephone Encounter (Signed)
Transition Care Management Follow-up Telephone Call Date of discharge and from where: 02/17/2021 from [facility] How have you been since you were released from the hospital? Pt stated that he is feeling better and the medication has helped a lot.  Any questions or concerns? No  Items Reviewed: Did the pt receive and understand the discharge instructions provided? Yes  Medications obtained and verified? Yes  Other? No  Any new allergies since your discharge? No  Dietary orders reviewed? No Do you have support at home? Yes   Functional Questionnaire: (I = Independent and D = Dependent) ADLs: I  Bathing/Dressing- I  Meal Prep- I  Eating- I  Maintaining continence- I  Transferring/Ambulation- I  Managing Meds- I   Follow up appointments reviewed:  PCP Hospital f/u appt confirmed? No   Specialist Hospital f/u appt confirmed? Yes  Scheduled to see Rosana Hoes, PT on 02/27/2021 @ 9:15am. Are transportation arrangements needed? No  If their condition worsens, is the pt aware to call PCP or go to the Emergency Dept.? Yes Was the patient provided with contact information for the PCP's office or ED? Yes Was to pt encouraged to call back with questions or concerns? Yes

## 2021-02-27 ENCOUNTER — Encounter: Payer: Self-pay | Admitting: Physical Therapy

## 2021-02-27 ENCOUNTER — Other Ambulatory Visit: Payer: Self-pay

## 2021-02-27 ENCOUNTER — Ambulatory Visit: Payer: Medicaid Other | Attending: Family Medicine | Admitting: Physical Therapy

## 2021-02-27 DIAGNOSIS — R296 Repeated falls: Secondary | ICD-10-CM | POA: Diagnosis not present

## 2021-02-27 DIAGNOSIS — M545 Low back pain, unspecified: Secondary | ICD-10-CM | POA: Diagnosis not present

## 2021-02-27 DIAGNOSIS — M6281 Muscle weakness (generalized): Secondary | ICD-10-CM | POA: Diagnosis not present

## 2021-02-27 DIAGNOSIS — G8929 Other chronic pain: Secondary | ICD-10-CM | POA: Insufficient documentation

## 2021-02-27 NOTE — Patient Instructions (Signed)
Access Code: [email or web address] URL: [email or web address] Date: 02/27/2021 Prepared by: Rosana Hoes  Exercises Supine Single Knee to Chest Stretch - 2 x daily - 7 x weekly - 2 reps - 30 hold Supine Piriformis Stretch with Foot on Ground - 2 x daily - 7 x weekly - 2 reps - 30 hold Supine Lower Trunk Rotation - 2 x daily - 7 x weekly - 10 reps - 5 hold Supine Posterior Pelvic Tilt - 2 x daily - 7 x weekly - 10 reps - 5 hold Child's Pose Stretch - 2 x daily - 7 x weekly - 3 reps - 30 hold Seated Hamstring Stretch - 2 x daily - 7 x weekly - 2 reps - 30 hold Sidelying Hip Abduction - 2 x daily - 7 x weekly - 2 sets - 10 reps Sit to Stand Without Arm Support - 2 x daily - 7 x weekly - 2 sets - 10 reps Standing Tandem Balance with Counter Support - 2 x daily - 7 x weekly - 3 sets - 30 hold [place] - 2 x daily - 7 x weekly - 3 sets - 30 hold

## 2021-02-27 NOTE — Therapy (Signed)
[facility] [address] [place], [place], [address] Phone: [phone]   Fax:  [phone]  Physical Therapy Evaluation  Patient Details  Name: Javier Avila MRN: [id number] Date of Birth: Sep 11, 1955 Referring Provider (PT): [name] Manners, MD   Encounter Date: 02/27/2021   PT End of Session - 02/27/21 0903     Visit Number 1    Number of Visits 8    Date for PT Re-Evaluation 04/24/21    Authorization Type [employer] [place]    PT Start Time 0905    PT Stop Time 0950    PT Time Calculation (min) 45 min    Activity Tolerance Patient tolerated treatment well    Behavior During Therapy [facility] for tasks assessed/performed             Past Medical History:  Diagnosis Date   Arthritis    Falls infrequently 02/28/2018   Heel pain, bilateral 12/06/2015   Hypertension    Lower back pain    Stroke [facility])     History reviewed. No pertinent surgical history.  There were no vitals filed for this visit.    Subjective Assessment - 02/27/21 0902     Subjective Patient reports lower back pain, started on left and working over to the right side and down the right leg occasionally. States happened years ago and had just gradually gotten worse. He has had previous injection that helped, but pain cam back. Mainly has pain with bending and reports that he had recent exacerbatrion after picking up sticks in yard. Patient also notes balance problems for about a year that has led to frequent falls. He tends to fall backwards when he does fall.    Limitations Lifting;House hold activities;Walking;Standing    How long can you sit comfortably? No limitaton    How long can you walk comfortably? "can walk but gets worn out especially with hills"    Diagnostic tests MRI scheduled    Patient Stated Goals Pain relief    Currently in Pain? Yes    Pain Score 9     Pain Location Back    Pain Orientation Lower    Pain Descriptors / Indicators  Tightness    Pain Type Chronic pain    Pain Radiating Towards not currently, will occasionally have radiation to right leg    Pain Onset More than a month ago    Pain Frequency Constant    Aggravating Factors  Bending, lifting, standing from seated position    Pain Relieving Factors Medication, injection                [facility] PT Assessment - 02/27/21 0001       Assessment   Medical Diagnosis Chronic midline low back pain without sciatica, Frequent falls    Referring Provider (PT) Hensel, Santiago Bumpers, MD    Onset Date/Surgical Date --   ongoing for years   Hand Dominance Right    Prior Therapy No      Precautions   Precautions Fall      Restrictions   Weight Bearing Restrictions No      Balance Screen   Has the patient fallen in the past 6 months Yes    How many times? 4-5 times    Has the patient had a decrease in activity level because of a fear of falling?  No    Is the patient reluctant to leave their home because of a fear of falling?  No  Prior Function   Level of Independence Independent    Vocation On disability    Leisure None reported      Cognition   Overall Cognitive Status Within Functional Limits for tasks assessed      Observation/Other Assessments   Observations Patient appears in no apparent distress    Focus on Therapeutic Outcomes (FOTO)  NA - MCD      Sensation   Light Touch Appears Intact      Coordination   Gross Motor Movements are Fluid and Coordinated Yes      Functional Tests   Functional tests Single leg stance;Sit to Stand      Single Leg Stance   Comments < 3 sec bilaterally without UE support, unsteady with high guard      Sit to Stand   Comments Patient initially requires UE support to stand, unbalanced initially with mod guard and wide BOS      Posture/Postural Control   Posture Comments Rounded shoulder posture      ROM / Strength   AROM / PROM / Strength AROM;Strength      AROM   Overall AROM Comments Lumbar pain  reported in all directions of movement    AROM Assessment Site Lumbar    Lumbar Flexion 50% - fingertips to proximal shin    Lumbar Extension 50%    Lumbar - Right Side Bend 75% - fingertip distal thigh    Lumbar - Left Side Bend 75% - fingertip distal thigh    Lumbar - Right Rotation 50%    Lumbar - Left Rotation 50%      Strength   Overall Strength Comments Core strength grossly 4-/5 MMT    Strength Assessment Site Hip;Knee    Right/Left Hip Right;Left    Right Hip Flexion 4-/5    Right Hip Extension 4-/5    Right Hip ABduction 4-/5    Left Hip Flexion 4-/5    Left Hip Extension 4-/5    Left Hip ABduction 4-/5    Right/Left Knee Right;Left    Right Knee Flexion 5/5    Right Knee Extension 5/5    Left Knee Flexion 5/5    Left Knee Extension 5/5      Flexibility   Soft Tissue Assessment /Muscle Length yes    Hamstrings Limited bilaterally    Quadriceps WFL    Piriformis Slightly limited    Quadratus Lumborum Limited R > L      Palpation   Spinal mobility Hypomobile with increased localized pain with CPAs throughout lumbar region    SI assessment  Not assessed    Palpation comment TTP R > L lumbar paraspinals      Special Tests   Other special tests lumbar radicular testing negative for reproduction of LE symptoms      Transfers   Transfers Independent with all Transfers    Comments Patient occasionally with unsteadiness when rising from seated position      High Level Balance   High Level Balance Comments Tandem stance > 30 sec bilaterally but with increased sway, decreased control when shifting weight posteriorly on heels                        Objective measurements completed on examination: See above findings.     [facility] Adult PT Treatment/Exercise:  Therapeutic Exercise: SKTC stretch x 30 sec each Piriformis stretch x 30 sec each LTR 10 x 5 sec Posterior pelvic tilt 10 x  5 sec Child's pose stretch x 30 sec Seated hamstring stretch x 30  sec Sidelying hip abduction x 10 Sit<>stand x 10 - without UE support and gaining balance when standing  Manual Therapy: N/A  Neuromuscular re-ed: Tandem stance x 30 sec Sagittal plane weight shifts (church pew) x 30 sec  Therapeutic Activity: N/A  Modalities: N/A  Self Care: N/A            PT Education - 02/27/21 0903     Education Details Exam findings, POC, HEP    Person(s) Educated Patient    Methods Explanation;Demonstration;Tactile cues;Verbal cues;Handout    Comprehension Verbalized understanding;Returned demonstration;Verbal cues required;Tactile cues required;Need further instruction              PT Short Term Goals - 02/27/21 1100       PT SHORT TERM GOAL #1   Title Patient will be I with initial HEP to progress with PT    Baseline HEP provided at eval    Time 4    Period Weeks    Status New    Target Date 03/27/21      PT SHORT TERM GOAL #2   Title Patient will report </= 6/10 pain in order to reduce functional limitation    Baseline patient reports 9/10 pain at eval    Time 4    Period Weeks    Status New    Target Date 03/27/21      PT SHORT TERM GOAL #3   Title Patient will demonstrate >/= 75% lumbar flexion motion to improve ability to perform household tasks    Baseline 50% lumbar flexion    Time 4    Period Weeks    Status New    Target Date 03/27/21               PT Long Term Goals - 02/27/21 1101       PT LONG TERM GOAL #1   Title Patient will be I with final HEP to maintain progress from PT    Baseline HEP provided at eval    Time 8    Period Weeks    Status New    Target Date 04/24/21      PT LONG TERM GOAL #2   Title Patient will demonstrate >/= 4/5 MMT for gross core and hip strength to improve lifting ability    Baseline grossly 4-/5 MMT of core and hips    Time 8    Period Weeks    Status New    Target Date 04/24/21      PT LONG TERM GOAL #3   Title Patient will report </= 3/10 pain with activity  to reduce functional limitation    Baseline 9/10 pain level at eval    Time 8    Period Weeks    Status New    Target Date 04/24/21      PT LONG TERM GOAL #4   Title Patient will demonstrate SLS >/= 10 sec bilaterally to improve balance and reduce fall risk    Baseline < 3 sec bilaterally at eval    Time 8    Period Weeks    Status New    Target Date 04/24/21                    Plan - 02/27/21 1050     Clinical Impression Statement Patient presents to PT with report of chronic low back pain that have been gradually worsening,  and impaired balance with frequent falls. Patient exhibits non-specific lumbar pain, majority seems to be muscular at this point but does report occasional radicular symptoms, however non reproduced this visit. He exhibits limitations in lumbar range of motion and strength deficits of core and hip regions. Patient also demonstrates balance deficit especially in sagittal plane that seems to occur mainly with transfers. He does not exhibit any sensation or gross strength deficits of the LEs, but does report occasional dizziness and headaches that may contribute to falls. No dizziness or reproduced this visit. Patient was provided exercises to initiate mobility and strength training and he would benefit from continued skilled PT to reduce lumabr pain, improved ability to perform household tasks, and imrpove balance to reduce risk of falls.    Personal Factors and Comorbidities Past/Current Experience;Time since onset of injury/illness/exacerbation    Examination-Activity Limitations Locomotion Level;Stand;Lift;Bend    Examination-Participation Restrictions Cleaning;Occupation;Community Activity;Yard Work    Stability/Clinical Decision Making Stable/Uncomplicated    Optometrist Low    Rehab Potential Good    PT Frequency 1x / week    PT Duration 8 weeks    PT Treatment/Interventions ADLs/Self Care Home Management;Aquatic  Therapy;Cryotherapy;Electrical Stimulation;Iontophoresis 4mg /ml Dexamethasone;Moist Heat;Traction;Ultrasound;Neuromuscular re-education;Balance training;Therapeutic exercise;Therapeutic activities;Functional mobility training;Stair training;Gait training;Patient/family education;Manual techniques;Dry needling;Passive range of motion;Taping;Vasopneumatic Device;Spinal Manipulations;Joint Manipulations    PT Next Visit Plan Review HEP and progress PRN, manual/dry needling for lumbar paraspinals, continue stretching for lumbar/LEs, progress core/lumbar strengthening    PT Home Exercise Plan [id number]    Consulted and Agree with Plan of Care Patient             Patient will benefit from skilled therapeutic intervention in order to improve the following deficits and impairments:  Decreased range of motion, Decreased activity tolerance, Pain, Increased muscle spasms, Decreased balance, Impaired flexibility, Improper body mechanics, Postural dysfunction, Decreased strength  Visit Diagnosis: Chronic bilateral low back pain, unspecified whether sciatica present  Repeated falls  Muscle weakness (generalized)     Problem List Patient Active Problem List   Diagnosis Date Noted   Frequent falls 12/12/2020   Acute lumbar back pain 12/10/2018   Angina pectoris (HCC) 12/10/2018   Cigarette smoker 08/23/2018   COPD GOLD 0/ still smoking  08/22/2018   Dyspnea on exertion 01/27/2018   Health care maintenance 12/05/2017   Hyperlipidemia 12/06/2015   History of hypertension 12/06/2015   Pre-diabetes 12/06/2015   Chronic lower back pain 12/06/2015    02/05/2016, PT, DPT, LAT, ATC 02/27/21  11:19 AM Phone: [phone] Fax: [phone]   [facility] [address] [place], [place], [place] Phone: [phone]   Fax:  [phone]  Name: Yer Olivencia MRN: Hughie Closs Date of Birth: 04-24-1956    Check all possible CPT codes: 97110-  Therapeutic Exercise, [phone]- Neuro Re-education, [phone] - Gait Training, [phone] - Manual Therapy, [phone] - Therapeutic Activities, [phone] - Self Care, [phone] - Electrical stimulation (unattended), and 90300 - Aquatic therapy

## 2021-03-08 ENCOUNTER — Ambulatory Visit: Payer: Medicaid Other | Attending: Family Medicine | Admitting: Physical Therapy

## 2021-03-08 ENCOUNTER — Encounter: Payer: Self-pay | Admitting: Physical Therapy

## 2021-03-08 ENCOUNTER — Other Ambulatory Visit: Payer: Self-pay

## 2021-03-08 DIAGNOSIS — M545 Low back pain, unspecified: Secondary | ICD-10-CM | POA: Diagnosis not present

## 2021-03-08 DIAGNOSIS — M6281 Muscle weakness (generalized): Secondary | ICD-10-CM | POA: Diagnosis not present

## 2021-03-08 DIAGNOSIS — R296 Repeated falls: Secondary | ICD-10-CM | POA: Diagnosis not present

## 2021-03-08 DIAGNOSIS — G8929 Other chronic pain: Secondary | ICD-10-CM | POA: Insufficient documentation

## 2021-03-08 NOTE — Therapy (Signed)
[facility] [address] [place], [place], [address] Phone: [phone]   Fax:  [phone]  Physical Therapy Treatment  Patient Details  Name: Javier Avila MRN: [id number] Date of Birth: February 11, 1956 Referring Provider (PT): [name] Manners, MD   Encounter Date: 03/08/2021   PT End of Session - 03/08/21 0937     Visit Number 2    Number of Visits 8    Date for PT Re-Evaluation 04/24/21    Authorization Type [employer]-pending    PT Start Time 0931    PT Stop Time 1012    PT Time Calculation (min) 41 min             Past Medical History:  Diagnosis Date   Arthritis    Falls infrequently 02/28/2018   Heel pain, bilateral 12/06/2015   Hypertension    Lower back pain    Stroke [facility])     History reviewed. No pertinent surgical history.  There were no vitals filed for this visit.   Subjective Assessment - 03/08/21 0933     Subjective Exercises are helping my back pain some. Since I am not taking the muscle relaxer right now, it hurts when I walk in my back and sometimes in R and L legs.             [facility] Adult PT Treatment/Exercise:   Therapeutic Exercise: SKTC stretch x 30 sec each x 2 Piriformis stretch x 30 sec each x 2 LTR 10 x 5 sec Posterior pelvic tilt 15 x 5 sec Child's pose stretch x 30 sec x 2 Seated hamstring stretch x 30 sec x 2 Sidelying hip abduction x 15 Sit<>stand x 10 - without UE support and gaining balance when standing Heel raise to toe raises x 10 -without UE   Manual Therapy: N/A   Neuromuscular re-ed: Tandem stance x 30 sec, eyes closed SLS  >15 sec each without UE SLS with hip abduction and hip flexion x 10 reps each without UE Sagittal plane weight shifts (church pew) x 30 sec Tandem gait without UE, side stepping without UE   Therapeutic Activity: N/A   Modalities: N/A   Self Care: N/A     PT Short Term Goals - 02/27/21 1100       PT SHORT TERM GOAL #1    Title Patient will be I with initial HEP to progress with PT    Baseline HEP provided at eval    Time 4    Period Weeks    Status New    Target Date 03/27/21      PT SHORT TERM GOAL #2   Title Patient will report </= 6/10 pain in order to reduce functional limitation    Baseline patient reports 9/10 pain at eval    Time 4    Period Weeks    Status New    Target Date 03/27/21      PT SHORT TERM GOAL #3   Title Patient will demonstrate >/= 75% lumbar flexion motion to improve ability to perform household tasks    Baseline 50% lumbar flexion    Time 4    Period Weeks    Status New    Target Date 03/27/21               PT Long Term Goals - 02/27/21 1101       PT LONG TERM GOAL #1   Title Patient will be I with final HEP to maintain  progress from PT    Baseline HEP provided at eval    Time 8    Period Weeks    Status New    Target Date 04/24/21      PT LONG TERM GOAL #2   Title Patient will demonstrate >/= 4/5 MMT for gross core and hip strength to improve lifting ability    Baseline grossly 4-/5 MMT of core and hips    Time 8    Period Weeks    Status New    Target Date 04/24/21      PT LONG TERM GOAL #3   Title Patient will report </= 3/10 pain with activity to reduce functional limitation    Baseline 9/10 pain level at eval    Time 8    Period Weeks    Status New    Target Date 04/24/21      PT LONG TERM GOAL #4   Title Patient will demonstrate SLS >/= 10 sec bilaterally to improve balance and reduce fall risk    Baseline < 3 sec bilaterally at eval    Time 8    Period Weeks    Status New    Target Date 04/24/21                   Plan - 03/08/21 0950     Clinical Impression Statement Pt reports compliance with HEP. He reports min improvement thus far. His pain is 3/10 this morning. Reviewed HEP and progressed with standing balance. He did not c/o back pain in standing however did feel bilateral lateral hip discomfort.  He was able to perform  Tandem stance with eyes closed for >30 sec as well as dynamic SLS with > 15 sec.    PT Treatment/Interventions ADLs/Self Care Home Management;Aquatic Therapy;Cryotherapy;Electrical Stimulation;Iontophoresis 4mg /ml Dexamethasone;Moist Heat;Traction;Ultrasound;Neuromuscular re-education;Balance training;Therapeutic exercise;Therapeutic activities;Functional mobility training;Stair training;Gait training;Patient/family education;Manual techniques;Dry needling;Passive range of motion;Taping;Vasopneumatic Device;Spinal Manipulations;Joint Manipulations    PT Next Visit Plan Review HEP and progress PRN, manual/dry needling for lumbar paraspinals, continue stretching for lumbar/LEs, progress core/lumbar strengthening    PT Home Exercise Plan [id number]    Consulted and Agree with Plan of Care Patient             Patient will benefit from skilled therapeutic intervention in order to improve the following deficits and impairments:  Decreased range of motion, Decreased activity tolerance, Pain, Increased muscle spasms, Decreased balance, Impaired flexibility, Improper body mechanics, Postural dysfunction, Decreased strength  Visit Diagnosis: Chronic bilateral low back pain, unspecified whether sciatica present  Repeated falls  Muscle weakness (generalized)     Problem List Patient Active Problem List   Diagnosis Date Noted   Frequent falls 12/12/2020   Acute lumbar back pain 12/10/2018   Angina pectoris (HCC) 12/10/2018   Cigarette smoker 08/23/2018   COPD GOLD 0/ still smoking  08/22/2018   Dyspnea on exertion 01/27/2018   Health care maintenance 12/05/2017   Hyperlipidemia 12/06/2015   History of hypertension 12/06/2015   Pre-diabetes 12/06/2015   Chronic lower back pain 12/06/2015    02/05/2016, PTA 03/08/2021, 10:00 AM  [facility] [address] [place], [place], [place] Phone: [phone]   Fax:   [phone]  Name: Javier Avila MRN: Hughie Closs Date of Birth: 07-27-1955

## 2021-03-10 ENCOUNTER — Other Ambulatory Visit: Payer: Medicaid Other

## 2021-03-14 ENCOUNTER — Ambulatory Visit: Payer: Medicaid Other | Admitting: Physical Therapy

## 2021-03-14 ENCOUNTER — Other Ambulatory Visit: Payer: Self-pay

## 2021-03-14 ENCOUNTER — Encounter: Payer: Self-pay | Admitting: Physical Therapy

## 2021-03-14 DIAGNOSIS — M6281 Muscle weakness (generalized): Secondary | ICD-10-CM | POA: Diagnosis not present

## 2021-03-14 DIAGNOSIS — R296 Repeated falls: Secondary | ICD-10-CM | POA: Diagnosis not present

## 2021-03-14 DIAGNOSIS — G8929 Other chronic pain: Secondary | ICD-10-CM

## 2021-03-14 DIAGNOSIS — M545 Low back pain, unspecified: Secondary | ICD-10-CM | POA: Diagnosis not present

## 2021-03-14 NOTE — Therapy (Signed)
[facility] [address] [place], [place], [address] Phone: [phone]   Fax:  [phone]  Physical Therapy Treatment  Patient Details  Name: Javier Avila MRN: [id number] Date of Birth: 1955-06-29 Referring Provider (PT): [name] Manners, MD   Encounter Date: 03/14/2021   PT End of Session - 03/14/21 1012     Visit Number 3    Number of Visits 8    Date for PT Re-Evaluation 04/24/21    Authorization Type MCD [employer]-pending    PT Start Time 1010    PT Stop Time 1055    PT Time Calculation (min) 45 min             Past Medical History:  Diagnosis Date   Arthritis    Falls infrequently 02/28/2018   Heel pain, bilateral 12/06/2015   Hypertension    Lower back pain    Stroke [facility])     History reviewed. No pertinent surgical history.  There were no vitals filed for this visit.   Subjective Assessment - 03/14/21 1010     Subjective Back is "so so." The exercises help. The cold weather affects my knees. Not that bad of back pain today. 2/10 back pain.    Currently in Pain? Yes    Pain Score 2     Pain Location Back    Pain Orientation Lower    Pain Descriptors / Indicators Aching    Pain Type Chronic pain    Aggravating Factors  bedning , lifting, standing from seated position.    Pain Relieving Factors meds, injection            [facility] Adult PT Treatment/Exercise:   Therapeutic Exercise: SKTC stretch x 30 sec each x 2 Piriformis stretch x 30 sec each x 2 LTR 10 x 5 sec Posterior pelvic tilt 15 x 5 sec Bridge with initial PPT x 15 Child's pose stretch x 30 sec x 2- modified to seated EOM Sidelying hip abduction x 15 Sit<>stand x 10 - 10# holding dumbbell at chest  Slant board stretch x 60 sec , bilat   Manual Therapy: N/A   Neuromuscular re-ed: Tandem stance on foam x 30 sec, head turns SLS on foam >30 sec, without UE Stepping over hurdles forward and lateral    Therapeutic  Activity: Hip hinge using wooden dowel for spine alignment- mod cues and reinforcement with repetitions. Will need additional education.    Modalities: N/A   Self Care: N/A            PT Short Term Goals - 02/27/21 1100       PT SHORT TERM GOAL #1   Title Patient will be I with initial HEP to progress with PT    Baseline HEP provided at eval    Time 4    Period Weeks    Status New    Target Date 03/27/21      PT SHORT TERM GOAL #2   Title Patient will report </= 6/10 pain in order to reduce functional limitation    Baseline patient reports 9/10 pain at eval    Time 4    Period Weeks    Status New    Target Date 03/27/21      PT SHORT TERM GOAL #3   Title Patient will demonstrate >/= 75% lumbar flexion motion to improve ability to perform household tasks    Baseline 50% lumbar flexion    Time 4    Period Weeks  Status New    Target Date 03/27/21               PT Long Term Goals - 02/27/21 1101       PT LONG TERM GOAL #1   Title Patient will be I with final HEP to maintain progress from PT    Baseline HEP provided at eval    Time 8    Period Weeks    Status New    Target Date 04/24/21      PT LONG TERM GOAL #2   Title Patient will demonstrate >/= 4/5 MMT for gross core and hip strength to improve lifting ability    Baseline grossly 4-/5 MMT of core and hips    Time 8    Period Weeks    Status New    Target Date 04/24/21      PT LONG TERM GOAL #3   Title Patient will report </= 3/10 pain with activity to reduce functional limitation    Baseline 9/10 pain level at eval    Time 8    Period Weeks    Status New    Target Date 04/24/21      PT LONG TERM GOAL #4   Title Patient will demonstrate SLS >/= 10 sec bilaterally to improve balance and reduce fall risk    Baseline < 3 sec bilaterally at eval    Time 8    Period Weeks    Status New    Target Date 04/24/21                   Plan - 03/14/21 1030     Clinical Impression  Statement Pt reports low level of back pain on arrival. He reports compliance with HEP. Continued with unlevel surface balance challenges and dynamic balance which he performs well. Progressed mat based core strength and reviewed stretches. Began hip hinge /lifting education without weight using wooden dowel to provide feedback on spine alignment. He required mod cues and reinforement to perform correctly.    PT Treatment/Interventions ADLs/Self Care Home Management;Aquatic Therapy;Cryotherapy;Electrical Stimulation;Iontophoresis 4mg /ml Dexamethasone;Moist Heat;Traction;Ultrasound;Neuromuscular re-education;Balance training;Therapeutic exercise;Therapeutic activities;Functional mobility training;Stair training;Gait training;Patient/family education;Manual techniques;Dry needling;Passive range of motion;Taping;Vasopneumatic Device;Spinal Manipulations;Joint Manipulations    PT Next Visit Plan Review hip hinge and progress as able; Review HEP and progress PRN, manual/dry needling for lumbar paraspinals, continue stretching for lumbar/LEs, progress core/lumbar strengthening    PT Home Exercise Plan [id number]             Patient will benefit from skilled therapeutic intervention in order to improve the following deficits and impairments:  Decreased range of motion, Decreased activity tolerance, Pain, Increased muscle spasms, Decreased balance, Impaired flexibility, Improper body mechanics, Postural dysfunction, Decreased strength  Visit Diagnosis: Chronic bilateral low back pain, unspecified whether sciatica present  Repeated falls  Muscle weakness (generalized)     Problem List Patient Active Problem List   Diagnosis Date Noted   Frequent falls 12/12/2020   Acute lumbar back pain 12/10/2018   Angina pectoris (HCC) 12/10/2018   Cigarette smoker 08/23/2018   COPD GOLD 0/ still smoking  08/22/2018   Dyspnea on exertion 01/27/2018   Health care maintenance 12/05/2017   Hyperlipidemia  12/06/2015   History of hypertension 12/06/2015   Pre-diabetes 12/06/2015   Chronic lower back pain 12/06/2015    02/05/2016, PTA 03/14/2021, 11:01 AM  [facility] [address] [place], [place], [place] Phone: [phone]   Fax:  [phone]  Name: Javier Avila MRN: [id number] Date of Birth: 04-23-1956

## 2021-03-21 ENCOUNTER — Encounter: Payer: Self-pay | Admitting: Physical Therapy

## 2021-03-21 ENCOUNTER — Ambulatory Visit: Payer: Medicaid Other | Admitting: Physical Therapy

## 2021-03-21 ENCOUNTER — Other Ambulatory Visit: Payer: Self-pay

## 2021-03-21 DIAGNOSIS — R296 Repeated falls: Secondary | ICD-10-CM

## 2021-03-21 DIAGNOSIS — M545 Low back pain, unspecified: Secondary | ICD-10-CM | POA: Diagnosis not present

## 2021-03-21 DIAGNOSIS — G8929 Other chronic pain: Secondary | ICD-10-CM | POA: Diagnosis not present

## 2021-03-21 DIAGNOSIS — M6281 Muscle weakness (generalized): Secondary | ICD-10-CM

## 2021-03-21 NOTE — Therapy (Signed)
[facility] [address] [place], [place], [address] Phone: [phone]   Fax:  [phone]  Physical Therapy Treatment  Patient Details  Name: Javier Avila MRN: [id number] Date of Birth: 1955/08/22 Referring Provider (PT): [name] Manners, MD   Encounter Date: 03/21/2021   PT End of Session - 03/21/21 1005     Visit Number 4    Number of Visits 8    Date for PT Re-Evaluation 04/24/21    Authorization Type [employer]    Authorization Time Period 03/08/21 - 04/29/21    Authorization - Visit Number 3    Authorization - Number of Visits 8    PT Start Time 1000    PT Stop Time 1040    PT Time Calculation (min) 40 min    Activity Tolerance Patient tolerated treatment well    Behavior During Therapy [facility] for tasks assessed/performed             Past Medical History:  Diagnosis Date   Arthritis    Falls infrequently 02/28/2018   Heel pain, bilateral 12/06/2015   Hypertension    Lower back pain    Stroke [facility])     History reviewed. No pertinent surgical history.  There were no vitals filed for this visit.   Subjective Assessment - 03/21/21 1003     Subjective Patient reports he did some yard work yesterday and was feeling "alright" after that. He continues to have low back pain that is worsened with activity. Exercises are going well.    Patient Stated Goals Pain relief    Currently in Pain? Yes    Pain Score 2     Pain Location Back    Pain Orientation Lower    Pain Descriptors / Indicators Aching    Pain Type Chronic pain    Pain Onset More than a month ago    Pain Frequency Constant    Aggravating Factors  sitting extended periods then going to stand, lifting, bending                [facility] PT Assessment - 03/21/21 0001       AROM   Lumbar Flexion 50% - fingertips to proximal shin                    [facility] Adult PT Treatment/Exercise:   Therapeutic Exercise: Piriformis stretch 2 x  30 sec each LTR 10 x 5 sec Bridge with initial PPT 2 x 10 with 3 sec hold Sidelying hip abduction 2 x 15 Seated lumbar flexion stretch with physioball 5 x 10 sec Seated hamstring stretch 2 x 30 sec each Sit<>stand 2 x 10 holding 15# weight at chest  Deadlift with 45# from 8" box 2 x 10 Pallof press with green 2 x 10 each   Manual Therapy: N/A   Neuromuscular re-ed: N/A   Therapeutic Activity: N/A   Modalities: N/A   Self Care: N/A                PT Education - 03/21/21 1005     Education Details HEP    Person(s) Educated Patient    Methods Explanation;Demonstration;Verbal cues    Comprehension Verbalized understanding;Returned demonstration;Verbal cues required;Need further instruction              PT Short Term Goals - 03/21/21 1009       PT SHORT TERM GOAL #1   Title Patient will be I with initial HEP to  progress with PT    Baseline progressing with HEP    Time 4    Period Weeks    Status On-going    Target Date 03/27/21      PT SHORT TERM GOAL #2   Title Patient will report </= 6/10 pain in order to reduce functional limitation    Baseline patient reports 2/10 pain    Time 4    Period Weeks    Status On-going    Target Date 03/27/21      PT SHORT TERM GOAL #3   Title Patient will demonstrate >/= 75% lumbar flexion motion to improve ability to perform household tasks    Baseline 50% lumbar flexion    Time 4    Period Weeks    Status On-going    Target Date 03/27/21               PT Long Term Goals - 02/27/21 1101       PT LONG TERM GOAL #1   Title Patient will be I with final HEP to maintain progress from PT    Baseline HEP provided at eval    Time 8    Period Weeks    Status New    Target Date 04/24/21      PT LONG TERM GOAL #2   Title Patient will demonstrate >/= 4/5 MMT for gross core and hip strength to improve lifting ability    Baseline grossly 4-/5 MMT of core and hips    Time 8    Period Weeks    Status New     Target Date 04/24/21      PT LONG TERM GOAL #3   Title Patient will report </= 3/10 pain with activity to reduce functional limitation    Baseline 9/10 pain level at eval    Time 8    Period Weeks    Status New    Target Date 04/24/21      PT LONG TERM GOAL #4   Title Patient will demonstrate SLS >/= 10 sec bilaterally to improve balance and reduce fall risk    Baseline < 3 sec bilaterally at eval    Time 8    Period Weeks    Status New    Target Date 04/24/21                   Plan - 03/21/21 1007     Clinical Impression Statement Patient tolerated therapy well with no adverse effects. Therapy focused on continue stretching and strengthening to improve low back pain and balance. He is progressing well with his strengthening and was able to tolerate lifting 45#. Patient did report slight increase in pain with exercises and he continues to note persistent low back pain despite progression in exercises. No changes made to HEP this visit. He would benefit from continued skilled PT to reduce lumabr pain, improved ability to perform household tasks, and imrpove balance to reduce risk of falls.    PT Treatment/Interventions ADLs/Self Care Home Management;Aquatic Therapy;Cryotherapy;Electrical Stimulation;Iontophoresis 4mg /ml Dexamethasone;Moist Heat;Traction;Ultrasound;Neuromuscular re-education;Balance training;Therapeutic exercise;Therapeutic activities;Functional mobility training;Stair training;Gait training;Patient/family education;Manual techniques;Dry needling;Passive range of motion;Taping;Vasopneumatic Device;Spinal Manipulations;Joint Manipulations    PT Next Visit Plan Review hip hinge and progress as able; Review HEP and progress PRN, manual/dry needling for lumbar paraspinals, continue stretching for lumbar/LEs, progress core/lumbar strengthening    PT Home Exercise Plan [email or web address]    Consulted and Agree with Plan of Care Patient  Patient will benefit  from skilled therapeutic intervention in order to improve the following deficits and impairments:  Decreased range of motion, Decreased activity tolerance, Pain, Increased muscle spasms, Decreased balance, Impaired flexibility, Improper body mechanics, Postural dysfunction, Decreased strength  Visit Diagnosis: Chronic bilateral low back pain, unspecified whether sciatica present  Repeated falls  Muscle weakness (generalized)     Problem List Patient Active Problem List   Diagnosis Date Noted   Frequent falls 12/12/2020   Acute lumbar back pain 12/10/2018   Angina pectoris (HCC) 12/10/2018   Cigarette smoker 08/23/2018   COPD GOLD 0/ still smoking  08/22/2018   Dyspnea on exertion 01/27/2018   Health care maintenance 12/05/2017   Hyperlipidemia 12/06/2015   History of hypertension 12/06/2015   Pre-diabetes 12/06/2015   Chronic lower back pain 12/06/2015    Rosana Hoes, PT, DPT, LAT, ATC 03/21/21  10:50 AM Phone: [phone] Fax: [phone]   [facility] [address] [place], [place], [address] Phone: [phone]   Fax:  [phone]  Name: Ajdin Macke MRN: [id number] Date of Birth: Mar 28, 1956

## 2021-03-28 ENCOUNTER — Encounter: Payer: Self-pay | Admitting: Physical Therapy

## 2021-03-28 ENCOUNTER — Other Ambulatory Visit: Payer: Self-pay

## 2021-03-28 ENCOUNTER — Ambulatory Visit: Payer: Medicaid Other | Admitting: Physical Therapy

## 2021-03-28 DIAGNOSIS — R296 Repeated falls: Secondary | ICD-10-CM | POA: Diagnosis not present

## 2021-03-28 DIAGNOSIS — G8929 Other chronic pain: Secondary | ICD-10-CM | POA: Diagnosis not present

## 2021-03-28 DIAGNOSIS — M545 Low back pain, unspecified: Secondary | ICD-10-CM

## 2021-03-28 DIAGNOSIS — M6281 Muscle weakness (generalized): Secondary | ICD-10-CM

## 2021-03-28 NOTE — Therapy (Signed)
[place], [place], [address] Phone: [phone]   Fax:  [phone]  Physical Therapy Treatment / Discharge  Patient Details  Name: Javier Avila MRN: [id number] Date of Birth: Oct 27, 1955 Referring Provider (PT): Zenia Resides, MD   Encounter Date: 03/28/2021   PT End of Session - 03/28/21 1009     Visit Number 5    Number of Visits 8    Date for PT Re-Evaluation 04/24/21    Authorization Type [employer]    Authorization Time Period 03/08/21 - 04/29/21    Authorization - Visit Number 4    Authorization - Number of Visits 8    PT Start Time 1000    PT Stop Time 1045    PT Time Calculation (min) 45 min    Activity Tolerance Patient tolerated treatment well    Behavior During Therapy [facility] for tasks assessed/performed             Past Medical History:  Diagnosis Date   Arthritis    Falls infrequently 02/28/2018   Heel pain, bilateral 12/06/2015   Hypertension    Lower back pain    Stroke [facility])     History reviewed. No pertinent surgical history.  There were no vitals filed for this visit.   Subjective Assessment - 03/28/21 1005     Subjective Patient reports he is doing well, exercises are going pretty good. States that when he is walking he still gets a little pain, and the cold weather affects his knees which makes walking a little more difficult.    How long can you walk comfortably? "can walk a long as he needs too, still has trouble with hills"    Patient Stated Goals Pain relief    Currently in Pain? Yes    Pain Score 3     Pain Location Back    Pain Orientation Lower    Pain Descriptors / Indicators --   "pinchy"   Pain Type Chronic pain    Pain Onset More than a month ago    Pain Frequency Intermittent    Aggravating Factors  sitting extended periods then going to stand, lifting, bending                [facility] PT Assessment - 03/28/21 0001       Assessment    Medical Diagnosis Chronic midline low back pain without sciatica, Frequent falls    Referring Provider (PT) Zenia Resides, MD      Precautions   Precautions None      Restrictions   Weight Bearing Restrictions No      Balance Screen   How many times? No fall since starting therapy      Prior Function   Level of Independence Independent      Observation/Other Assessments   Observations Patient appears in no apparent distress    Focus on Therapeutic Outcomes (FOTO)  NA - MCD      Single Leg Stance   Comments Left 27 seconds, Right 15 seconds      Sit to Stand   Comments Patient able to stand without UE assist and maintain balance in standing position      AROM   Lumbar Flexion 75% - fingertips to distal shin, able to reach toes with knee flexion to reduce hamstring tension      Strength   Overall Strength Comments Core strength grossly 4/5 MMT    Right Hip Extension 4/5  Right Hip ABduction 4/5    Left Hip Extension 4/5    Left Hip ABduction 4/5      Flexibility   Hamstrings Limited bilaterally                [facility] Adult PT Treatment/Exercise:   Therapeutic Exercise: NuStep L7 x 5 min with LE while taking subjective Seated hamstring stretch 2 x 30 sec each Piriformis stretch 2 x 30 sec each LTR 10 x 5 sec Bridge with initial PPT 2 x 10 with 3 sec hold Sidelying hip abduction 2 x 15 Seated lumbar flexion stretch with physioball 5 x 10 sec Sit<>stand 2 x 10 holding 25# weight at chest  Deadlift with 45# from 8" box 2 x 10 Tandem stance 2 x 30 sec each without UE support   Manual Therapy: N/A   Neuromuscular re-ed: N/A   Therapeutic Activity: N/A   Modalities: N/A   Self Care: N/A             PT Education - 03/28/21 1008     Education Details POC for discharge, HEP, follow-up with doctor for MRI    Person(s) Educated Patient    Methods Explanation;Demonstration;Verbal cues    Comprehension Verbalized understanding;Returned  demonstration;Verbal cues required;Need further instruction              PT Short Term Goals - 03/28/21 1015       PT SHORT TERM GOAL #1   Title Patient will be I with initial HEP to progress with PT    Baseline patient independent with HEP    Time 4    Period Weeks    Status Achieved    Target Date 03/27/21      PT SHORT TERM GOAL #2   Title Patient will report </= 6/10 pain in order to reduce functional limitation    Baseline patient reports pain has been consistently < 6/10    Time 4    Period Weeks    Status Achieved    Target Date 03/27/21      PT SHORT TERM GOAL #3   Title Patient will demonstrate >/= 75% lumbar flexion motion to improve ability to perform household tasks    Baseline 75% - fingertips to distal shin, able to reach toes with knee flexion to reduce hamstring tension    Time 4    Period Weeks    Status Achieved    Target Date 03/27/21               PT Long Term Goals - 03/28/21 1018       PT LONG TERM GOAL #1   Title Patient will be I with final HEP to maintain progress from PT    Baseline patient independent with HEP    Time 8    Period Weeks    Status Achieved      PT LONG TERM GOAL #2   Title Patient will demonstrate >/= 4/5 MMT for gross core and hip strength to improve lifting ability    Baseline grossly 4/5 MMT of core and hips    Time 8    Period Weeks    Status Achieved      PT LONG TERM GOAL #3   Title Patient will report </= 3/10 pain with activity to reduce functional limitation    Baseline patient reports pain 3/10    Time 8    Period Weeks    Status Achieved      PT LONG  TERM GOAL #4   Title Patient will demonstrate SLS >/= 10 sec bilaterally to improve balance and reduce fall risk    Baseline left 27 seconds, right 15 seconds    Time 8    Period Weeks    Status Achieved                   Plan - 03/28/21 1010     Clinical Impression Statement Patient tolerated therapy well with no adverse effects.  Patient has made great progress in therapy, demonstrating improvement in his lumbar motion, core and hip strength, lifting ability to perform household tasks, and balance with no falls reported since start of therapy. He is independent with his HEP and has achieved established goals, and patient reports he is currently please with current level but continues to have back pain and would like to pursue and MRI for lumbar spine due to persistent pain. Patient will be discharged from PT as he has met current goals and is planning to pursure MRI for lumbar spine.    PT Treatment/Interventions ADLs/Self Care Home Management;Aquatic Therapy;Cryotherapy;Electrical Stimulation;Iontophoresis 24m/ml Dexamethasone;Moist Heat;Traction;Ultrasound;Neuromuscular re-education;Balance training;Therapeutic exercise;Therapeutic activities;Functional mobility training;Stair training;Gait training;Patient/family education;Manual techniques;Dry needling;Passive range of motion;Taping;Vasopneumatic Device;Spinal Manipulations;Joint Manipulations    PT Next Visit Plan NA - Discharge    PT Home Exercise Plan [email or web address]    Consulted and Agree with Plan of Care Patient             Patient will benefit from skilled therapeutic intervention in order to improve the following deficits and impairments:  Decreased range of motion, Decreased activity tolerance, Pain, Increased muscle spasms, Decreased balance, Impaired flexibility, Improper body mechanics, Postural dysfunction, Decreased strength  Visit Diagnosis: Chronic bilateral low back pain, unspecified whether sciatica present  Repeated falls  Muscle weakness (generalized)     Problem List Patient Active Problem List   Diagnosis Date Noted   Frequent falls 12/12/2020   Acute lumbar back pain 12/10/2018   [place] [place] 12/10/2018   Cigarette smoker 08/23/2018   COPD GOLD 0/ still smoking  08/22/2018   Dyspnea on exertion 01/27/2018   Health care maintenance  12/05/2017   Hyperlipidemia 12/06/2015   History of hypertension 12/06/2015   Pre-diabetes 12/06/2015   Chronic lower back pain 12/06/2015    CHilda Blades PT, DPT, LAT, ATC 03/28/21  10:46 AM Phone: [phone]: [address] [address]Phone: [phone]  Fax:  [phone] Name: Javier ParagasMRN: [id number] of Birth: 728-Oct-1957  PHYSICAL THERAPY DISCHARGE SUMMARY  Visits from Start of Care: 5  Current functional level related to goals / functional outcomes: See above   Remaining deficits: See above   Education / Equipment: HEP   Patient agrees to discharge. Patient goals were met. Patient is being discharged due to meeting the stated rehab goals.

## 2021-03-28 NOTE — Patient Instructions (Signed)
Access Code: [email or web address] URL: [email or web address] Date: 03/28/2021 Prepared by: Rosana Hoes  Exercises Supine Single Knee to Chest Stretch - 2 x daily - 7 x weekly - 2 reps - 30 hold Supine Piriformis Stretch with Foot on Ground - 2 x daily - 7 x weekly - 2 reps - 30 hold Supine Lower Trunk Rotation - 2 x daily - 7 x weekly - 10 reps - 5 hold Supine Bridge - 1-2 x daily - 7 x weekly - 2 sets - 10 reps Child's Pose Stretch - 2 x daily - 7 x weekly - 3 reps - 30 hold Seated Hamstring Stretch - 2 x daily - 7 x weekly - 2 reps - 30 hold Sidelying Hip Abduction - 1-2 x daily - 7 x weekly - 2 sets - 10 reps Sit to Stand Without Arm Support - 1-2 x daily - 7 x weekly - 2 sets - 10 reps Standing Tandem Balance with Counter Support - 2 x daily - 7 x weekly - 3 sets - 30 hold [place] - 2 x daily - 7 x weekly - 3 sets - 30 hold

## 2021-05-22 ENCOUNTER — Other Ambulatory Visit: Payer: Self-pay

## 2021-05-22 ENCOUNTER — Ambulatory Visit
Admission: RE | Admit: 2021-05-22 | Discharge: 2021-05-22 | Disposition: A | Discharge status: Home / Self Care | Payer: Medicaid Other | Source: Ambulatory Visit | Attending: Physical Medicine and Rehabilitation | Admitting: Physical Medicine and Rehabilitation

## 2021-05-22 DIAGNOSIS — G8929 Other chronic pain: Secondary | ICD-10-CM

## 2021-05-22 DIAGNOSIS — M545 Low back pain, unspecified: Secondary | ICD-10-CM | POA: Diagnosis not present

## 2021-05-22 DIAGNOSIS — M5416 Radiculopathy, lumbar region: Secondary | ICD-10-CM

## 2021-05-22 IMAGING — MR MR LUMBAR SPINE W/O CM
4 of 5 series · 26 of 48 positions shown · non-contrast
Comparison: None.

CLINICAL DATA: Low back pain

EXAM:
MRI LUMBAR SPINE WITHOUT CONTRAST
TECHNIQUE: Multiplanar, multisequence MR imaging of the lumbar spine was
performed. No intravenous contrast was administered.

[Series 2: T2 · sagittal · 4.0mm · 1.17mm/px · 6 of 15 slices shown (1 of 2)]
[im 1/15]
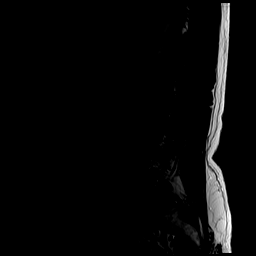
[im 3/15]
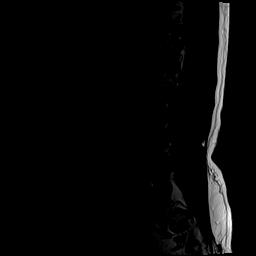
[im 6/15]
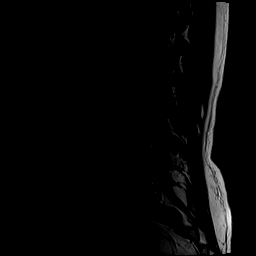
[im 9/15]
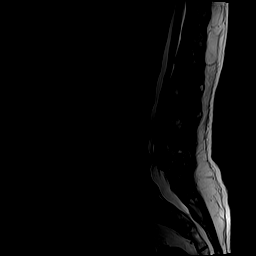
[im 12/15]
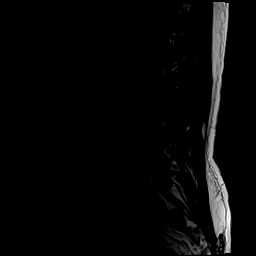
[im 15/15]
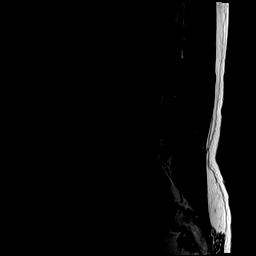

[Series 4: T1 · sagittal · 4.0mm · 1.17mm/px · 5 of 15 slices shown (1 of 2)]
[im 1/15]
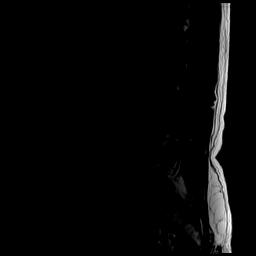
[im 4/15]
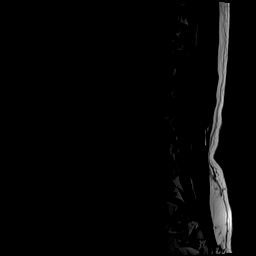
[im 8/15]
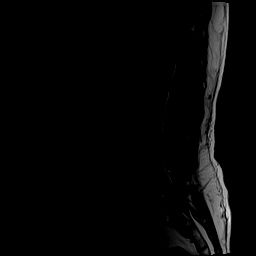
[im 11/15]
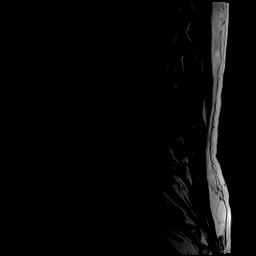
[im 15/15]
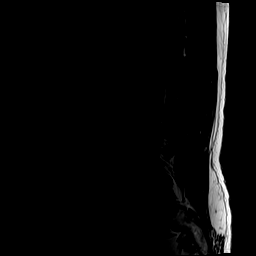

[Series 5: T2 · axial · 4.0mm · 0.39mm/px · z∈[-41,+175]mm · 10 of 43 slices shown (2 of 2)]
[im 3/43]
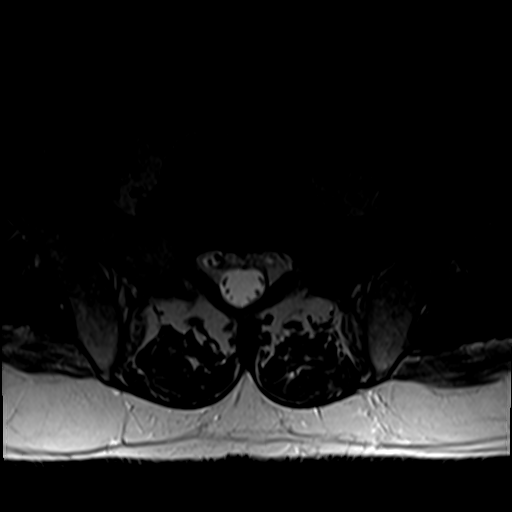
[im 6/43]
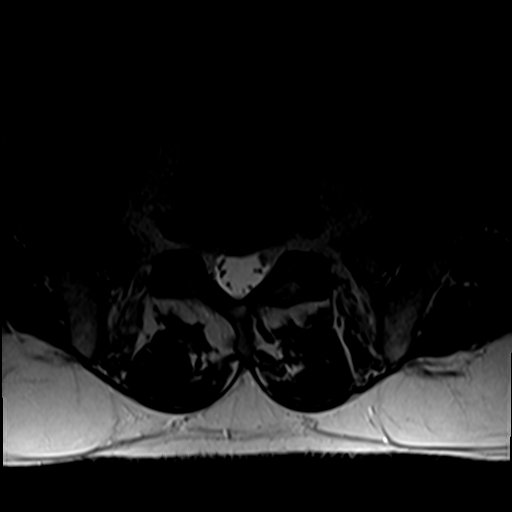
[im 9/43]
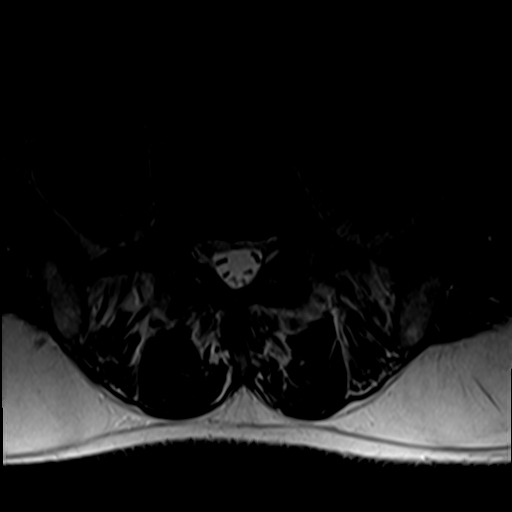
[im 15/43]
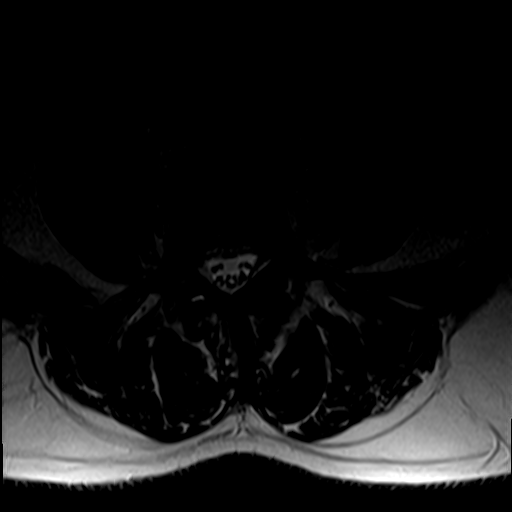
[im 20/43]
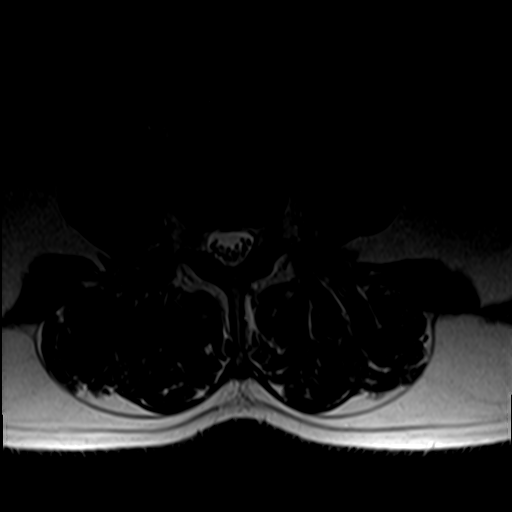
[im 23/43]
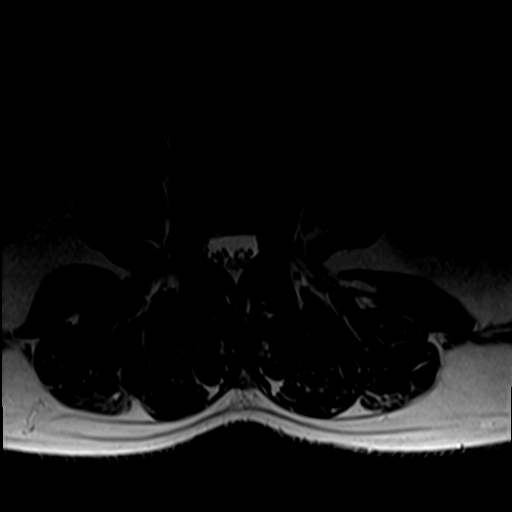
[im 26/43]
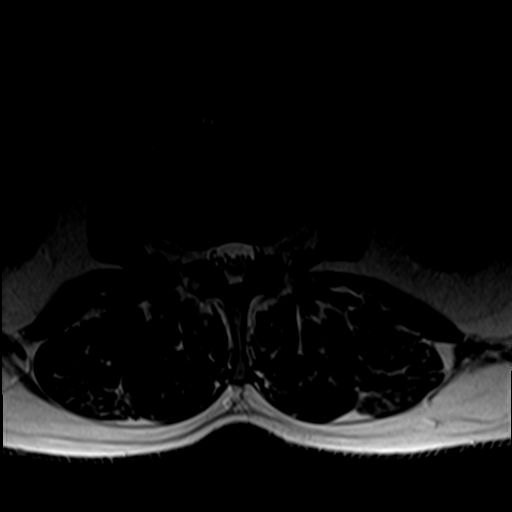
[im 31/43]
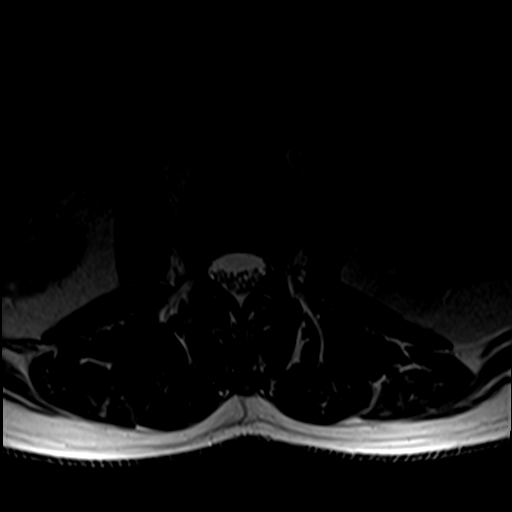
[im 37/43]
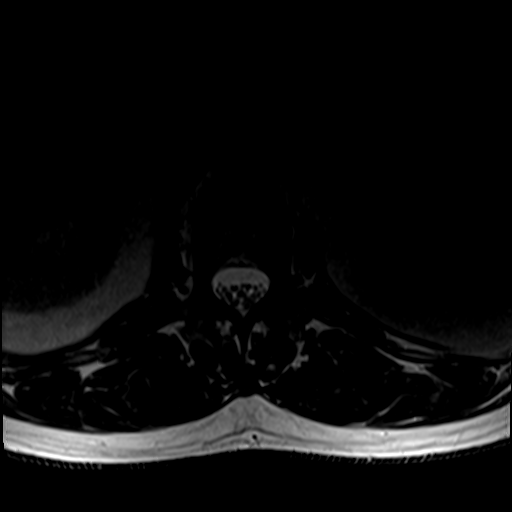
[im 43/43]
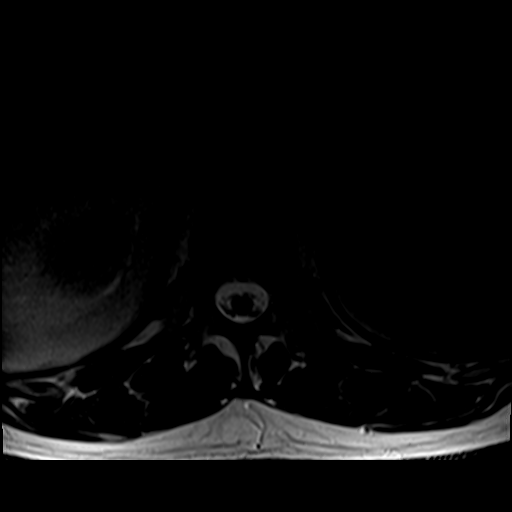

[Series 6: T1 · axial · 4.0mm · 0.39mm/px · z∈[-41,+146]mm · 5 of 43 slices shown (2 of 2)]
[im 3/43]
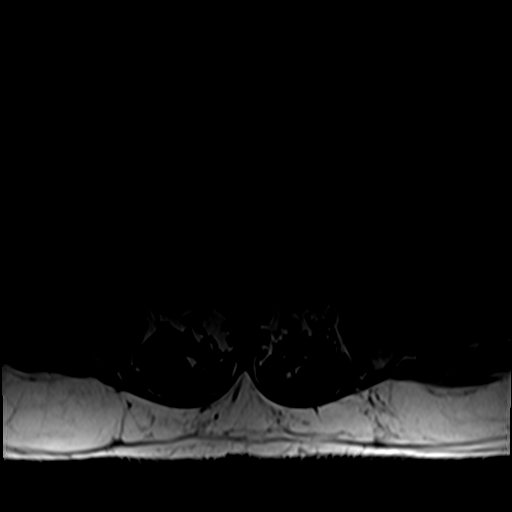
[im 6/43]
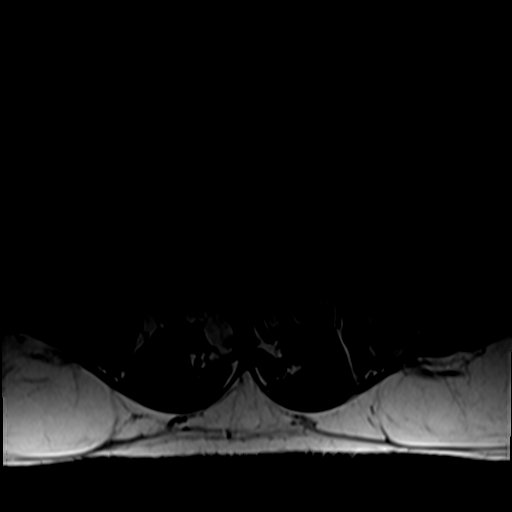
[im 9/43]
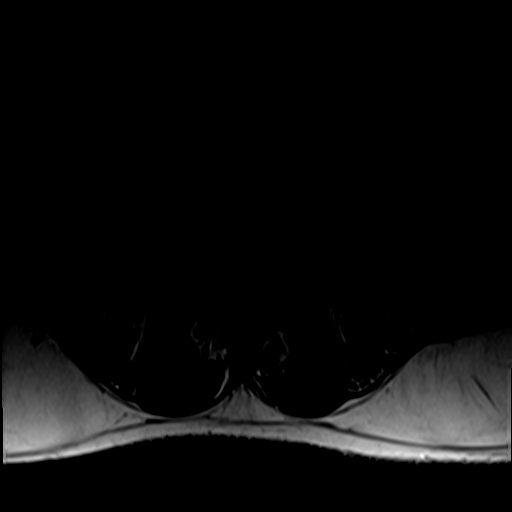
[im 23/43]
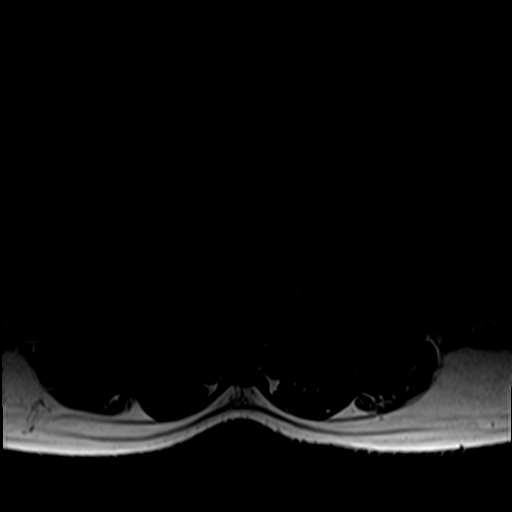
[im 37/43]
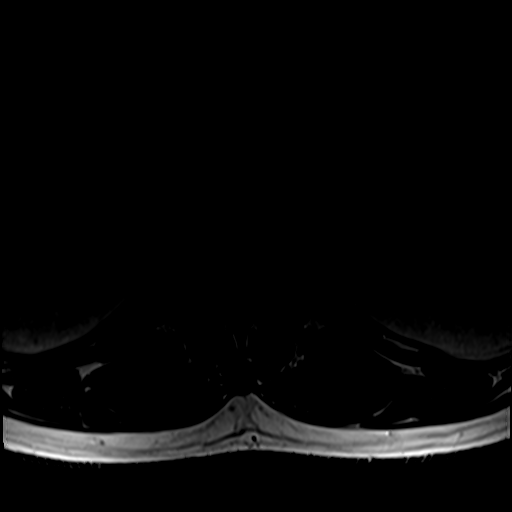

[26 of 48 positions shown; findings below may reference images not displayed]

FINDINGS: Segmentation:  Standard.

Alignment:  Mild dextrocurvature.  Otherwise physiologic.

Vertebrae: No acute fracture or suspicious osseous lesion.
Congenitally short pedicles, which narrow the AP diameter of the
spinal canal.

Conus medullaris and cauda equina: Conus extends to the L1 level.
Conus and cauda equina appear normal.

Paraspinal and other soft tissues: Negative.

Disc levels:

T12-L1: No significant disc bulge. No spinal canal stenosis or
neural foraminal narrowing.

L1-L2: No significant disc bulge. No spinal canal stenosis or neural
foraminal narrowing.

L2-L3: Mild disc bulge, with small right foraminal protrusion. Mild
facet arthropathy. No spinal canal stenosis or neural foraminal
narrowing.

L3-L4: Disc height loss and moderate disc bulge with central annular
fissure. Moderate facet arthropathy. Narrowing of the lateral
recesses. No spinal canal stenosis. Mild left neural foraminal
narrowing.

L4-L5: Moderate disc bulge. Mild facet arthropathy. Narrowing of the
lateral recesses. No spinal canal stenosis. Mild right neural
foraminal narrowing.

L5-S1: Disc bulge, somewhat eccentric to the right. Mild facet
arthropathy. No spinal canal stenosis or neural foraminal narrowing.
IMPRESSION: 1. L3-L4 mild left neural foraminal narrowing. Narrowing of the
lateral recesses at this level could affect the descending L4 nerve
roots.
2. L4-L5 mild right neural foraminal narrowing. Narrowing of the
lateral recesses at this level could affect the descending L5 nerve
roots.
3. No spinal canal stenosis.

## 2021-05-23 ENCOUNTER — Telehealth: Payer: Self-pay | Admitting: Physical Medicine and Rehabilitation

## 2021-05-23 NOTE — Telephone Encounter (Signed)
He needs OV to review recent MRI, MRI is in chart.

## 2021-05-31 ENCOUNTER — Ambulatory Visit (INDEPENDENT_AMBULATORY_CARE_PROVIDER_SITE_OTHER): Payer: Medicaid Other | Admitting: Physical Medicine and Rehabilitation

## 2021-05-31 ENCOUNTER — Other Ambulatory Visit: Payer: Self-pay

## 2021-05-31 ENCOUNTER — Encounter: Payer: Self-pay | Admitting: Physical Medicine and Rehabilitation

## 2021-05-31 VITALS — BP 145/84 | HR 61

## 2021-05-31 DIAGNOSIS — M7918 Myalgia, other site: Secondary | ICD-10-CM | POA: Diagnosis not present

## 2021-05-31 DIAGNOSIS — M5412 Radiculopathy, cervical region: Secondary | ICD-10-CM

## 2021-05-31 DIAGNOSIS — M25561 Pain in right knee: Secondary | ICD-10-CM

## 2021-05-31 DIAGNOSIS — M542 Cervicalgia: Secondary | ICD-10-CM

## 2021-05-31 DIAGNOSIS — M25562 Pain in left knee: Secondary | ICD-10-CM

## 2021-05-31 DIAGNOSIS — G8929 Other chronic pain: Secondary | ICD-10-CM

## 2021-05-31 DIAGNOSIS — M5416 Radiculopathy, lumbar region: Secondary | ICD-10-CM | POA: Diagnosis not present

## 2021-05-31 DIAGNOSIS — M48061 Spinal stenosis, lumbar region without neurogenic claudication: Secondary | ICD-10-CM | POA: Diagnosis not present

## 2021-05-31 DIAGNOSIS — M47816 Spondylosis without myelopathy or radiculopathy, lumbar region: Secondary | ICD-10-CM

## 2021-05-31 NOTE — Progress Notes (Signed)
Pt state Lower back pain that travels to his left knee. Pt state he has neck pain that travels to both shoulder. Pt state the pain causes headaches. Pt state any movement cause pain for his neck and lower back. Pt state he takes over the counter pain meds to help ease his pain.  Numeric Pain Rating Scale and Functional Assessment Average Pain 10 Pain Right Now 8 My pain is constant, sharp, dull, stabbing, tingling, and aching Pain is worse with: walking, bending, sitting, standing, and some activites Pain improves with: heat/ice and medication   In the last MONTH (on 0-10 scale) has pain interfered with the following?  1. General activity like being  able to carry out your everyday physical activities such as walking, climbing stairs, carrying groceries, or moving a chair?  Rating(7)  2. Relation with others like being able to carry out your usual social activities and roles such as  activities at home, at work and in your community. Rating(8)  3. Enjoyment of life such that you have  been bothered by emotional problems such as feeling anxious, depressed or irritable?  Rating(9)

## 2021-05-31 NOTE — Progress Notes (Signed)
Javier Avila - 66 y.o. male MRN [id number]  Date of birth: [birth year]  Office Visit Note: Visit Date: 05/31/2021 PCP: Eppie Gibson, MD Referred by: Eppie Gibson, MD  Subjective: Chief Complaint  Patient presents with   Neck - Pain   Right Shoulder - Pain   Left Shoulder - Pain   Lower Back - Pain   Left Knee - Pain   HPI: Javier Avila is a 66 y.o. male who comes in today for evaluation of chronic, worsening and severe bilateral lower back pain radiating down to both knees and bilateral neck pain radiating to both arms and down upper back. Patient states pain has been ongoing for several years.  Patient reports pain is exacerbated by prolonged sitting, describes his pain as a constant aching and sore sensation, currently rates as 8 out of 10.  Patient reports some relief of pain with home exercise regimen, ibuprofen and rest.  Patient did attend formal physical therapy at [place] recently and reports no relief of pain with these treatments.  Patient's recent lumbar MRI exhibits mild left foraminal and bilateral lateral recess narrowing at L3-L4 that could be affecting the descending L4 nerve roots.  There is also mild right foraminal and bilateral lateral recess narrowing at L4-L5 that could be affecting the descending L5 nerve roots.  There is moderate facet hypertrophy noted at L3-L4 and mild at L4-L5.  No high-grade spinal canal stenosis noted. Patient did have left L5-S1 interlaminar epidural steroid injection performed by Dr. Magnus Sinning in 2021 which he reports helped to alleviate his pain. Patient was also previously treated for chronic lower back and neck pain by Dr. Eunice Blase. Patient does report frequent falls, states this issues has been occurring for several months. Patient states his knees "give out" and he falls on the ground. Patient denies focal weakness, numbness and tingling.   Patient also reports issues with chronic, worsening  and severe bilateral neck pain that radiates down upper back and to bilateral arms. Patient reports pain has been ongoing for several years and is exacerbated by movement and activity. He describes pain as constant sharp, sore and shooting sensation, currently rates as 7 out of 10. Patient also reports his muscles in his neck and upper back feel extremely tight. Patient states he has tried heating pad, rest and medications but these treatments do not seem to alleviate his pain. Patients cervical x-rays from 2021 exhibits facet degeneration at C5-C6 and C6-C7. Patient has not had MRI imaging of cervical spine.   Review of Systems  Musculoskeletal:  Positive for back pain, joint pain and neck pain.  Neurological:  Negative for tingling, sensory change, focal weakness and weakness.  All other systems reviewed and are negative. Otherwise per HPI.  Assessment & Plan: Visit Diagnoses:    ICD-10-CM   1. Lumbar radiculopathy  M54.16 Ambulatory referral to Physical Medicine Rehab    2. 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 Facet hypertrophy of lumbar region  M47.816     4. Cervicalgia  M54.2 MR CERVICAL SPINE WO CONTRAST    5. Radiculopathy, cervical region  M54.12     6. Myofascial pain syndrome  M79.18     7. Chronic pain of both knees  M25.561 Ambulatory referral to Orthopedic Surgery   M25.562    G89.29        Plan: Findings:  1. Chronic, worsening and severe bilateral lower back pain radiating down to  both knees.  Patient continues to have excruciating and debilitating pain despite conservative therapies such as formal physical therapy, home exercise regimen, rest and use of medications.  Patient's clinical presentation and exam are consistent with L4/L5 nerve pattern. Patient did get good pain relief with previous lumbar epidural steroid injection in 2021. We believe the next step is to perform a diagnostic and hopefully therapeutic bilateral L3 transforaminal epidural steroid  injection under fluoroscopic guidance. Patient encouraged to remain active and to continue home exercise regimen as tolerated.   2. Chronic, worsening and severe bilateral neck pain that radiates down to upper back region into bilateral arms.  Patient continues to have severe pain despite good conservative therapies such as use of heating pad, rest and medications.  Patient's clinical presentation and exam is concerning for cervical radiculopathy as he does have pain shooting down both of his arms into his hands. We also feel there is a myofascial component contributing to his pain as his pain does radiate down to upper back.  We did speak with patient about possible treatment options in detail today.  We do recommend that patient regroup with our in-house physical therapy team to address his chronic neck problems, however patient states he does not feel this would be beneficial for him at this time.  We feel the next step is to place an order for a cervical MRI to rule out any possible causes of his chronic neck pain.  We will follow-up with patient after cervical MRI imaging is complete to review and to discuss further treatment options.  3. Patient did voice issues with frequent falling and feeling of his knees "giving out". We are concerned that his chronic knee issues could be contributing to frequent falls. We feel that patient would benefit from formal orthopedic evaluation. We did place a referral to Tawanna Cooler, [place] today and will work to get patient in with her quickly.   No red flag symptoms noted upon exam today.    Meds & Orders: No orders of the defined types were placed in this encounter.   Orders Placed This Encounter  Procedures   MR CERVICAL SPINE WO CONTRAST   Ambulatory referral to Physical Medicine Rehab   Ambulatory referral to Orthopedic Surgery    Follow-up: Return for Bilateral L3 transforaminal epidural steroid injection.   Procedures: No procedures performed       Clinical History: MRI LUMBAR SPINE WITHOUT CONTRAST   TECHNIQUE: Multiplanar, multisequence MR imaging of the lumbar spine was performed. No intravenous contrast was administered.   COMPARISON:  None.   FINDINGS: Segmentation:  Standard.   Alignment:  Mild dextrocurvature.  Otherwise physiologic.   Vertebrae: No acute fracture or suspicious osseous lesion. Congenitally short pedicles, which narrow the AP diameter of the spinal canal.   Conus medullaris and cauda equina: Conus extends to the L1 level. Conus and cauda equina appear normal.   Paraspinal and other soft tissues: Negative.   Disc levels:   T12-L1: No significant disc bulge. No spinal canal stenosis or neural foraminal narrowing.   L1-L2: No significant disc bulge. No spinal canal stenosis or neural foraminal narrowing.   L2-L3: Mild disc bulge, with small right foraminal protrusion. Mild facet arthropathy. No spinal canal stenosis or neural foraminal narrowing.   L3-L4: Disc height loss and moderate disc bulge with central annular fissure. Moderate facet arthropathy. Narrowing of the lateral recesses. No spinal canal stenosis. Mild left neural foraminal narrowing.   L4-L5: Moderate disc bulge. Mild facet arthropathy.  Narrowing of the lateral recesses. No spinal canal stenosis. Mild right neural foraminal narrowing.   L5-S1: Disc bulge, somewhat eccentric to the right. Mild facet arthropathy. No spinal canal stenosis or neural foraminal narrowing.   IMPRESSION: 1. L3-L4 mild left neural foraminal narrowing. Narrowing of the lateral recesses at this level could affect the descending L4 nerve roots. 2. L4-L5 mild right neural foraminal narrowing. Narrowing of the lateral recesses at this level could affect the descending L5 nerve roots. 3. No spinal canal stenosis.     Electronically Signed   By: Wiliam Ke M.D.   On: 05/23/2021 03:05 ----  12/14/2019 lumbar x-rays  X-rays lumbar spine  reveal moderate degenerative disc disease at L4-5 and  L5-S1.  Alignment is otherwise anatomic, no sign of neoplasm or  compression fracture.  Hip joints have mild degenerative change. Lavada Mesi, MD   He reports that he has been smoking cigarettes. He started smoking about 47 years ago. He has a 21.50 pack-year smoking history. He has never used smokeless tobacco. No results for input(s): HGBA1C, LABURIC in the last 8760 hours.  Objective:  VS:  HT:     WT:    BMI:      BP:(!) 145/84   HR:61bpm   TEMP: ( )   RESP:  Physical Exam Vitals and nursing note reviewed.  HENT:     Head: Normocephalic and atraumatic.     Right Ear: External ear normal.     Left Ear: External ear normal.     Nose: Nose normal.     Mouth/Throat:     Mouth: Mucous membranes are moist.  Eyes:     Extraocular Movements: Extraocular movements intact.  Cardiovascular:     Rate and Rhythm: Normal rate.     Pulses: Normal pulses.  Pulmonary:     Effort: Pulmonary effort is normal.  Abdominal:     General: Abdomen is flat. There is no distension.  Musculoskeletal:        General: Tenderness present.     Cervical back: Tenderness present.     Comments: Pt is slow to rise from seated position to standing. Good lumbar range of motion. Strong distal strength without clonus, no pain upon palpation of greater trochanters. Sensation intact bilaterally. Dysesthesias noted to bilateral L4/L5 dermatomes. Walks independently, gait steady.   Discomfort noted with flexion, extension and side-to-side rotation. Patient has good strength in the upper extremities including 5 out of 5 strength in wrist extension, long finger flexion and APB.  There is no atrophy of the hands intrinsically.  Sensation intact bilaterally. Negative Hoffman's sign.   Skin:    General: Skin is warm and dry.     Capillary Refill: Capillary refill takes less than 2 seconds.  Neurological:     General: No focal deficit present.     Mental Status: He is  alert and oriented to person, place, and time.  Psychiatric:        Mood and Affect: Mood normal.    Ortho Exam  Imaging: No results found.  Past Medical/Family/Surgical/Social History: Medications & Allergies reviewed per EMR, new medications updated. Patient Active Problem List   Diagnosis Date Noted   Frequent falls 12/12/2020   Acute lumbar back pain 12/10/2018   Angina pectoris (HCC) 12/10/2018   Cigarette smoker 08/23/2018   COPD GOLD 0/ still smoking  08/22/2018   Dyspnea on exertion 01/27/2018   Health care maintenance 12/05/2017   Hyperlipidemia 12/06/2015   History of hypertension 12/06/2015  Pre-diabetes 12/06/2015   Chronic lower back pain 12/06/2015   Past Medical History:  Diagnosis Date   Arthritis    Falls infrequently 02/28/2018   Heel pain, bilateral 12/06/2015   Hypertension    Lower back pain    Stroke [facility])    Family History  Family history unknown: Yes   History reviewed. No pertinent surgical history. Social History   Occupational History   Not on file  Tobacco Use   Smoking status: Every Day    Packs/day: 0.50    Years: 43.00    Pack years: 21.50    Types: Cigarettes    Start date: 04/30/1974   Smokeless tobacco: Never  Vaping Use   Vaping Use: Never used  Substance and Sexual Activity   Alcohol use: No   Drug use: Not Currently    Comment: About once monthly when in severe pain or needs to relax   Sexual activity: Not Currently

## 2021-06-06 ENCOUNTER — Ambulatory Visit: Payer: Self-pay

## 2021-06-06 ENCOUNTER — Ambulatory Visit (INDEPENDENT_AMBULATORY_CARE_PROVIDER_SITE_OTHER): Payer: Medicaid Other

## 2021-06-06 ENCOUNTER — Ambulatory Visit (INDEPENDENT_AMBULATORY_CARE_PROVIDER_SITE_OTHER): Payer: Medicaid Other | Admitting: Orthopaedic Surgery

## 2021-06-06 ENCOUNTER — Encounter: Payer: Self-pay | Admitting: Physician Assistant

## 2021-06-06 ENCOUNTER — Other Ambulatory Visit: Payer: Self-pay

## 2021-06-06 DIAGNOSIS — M5416 Radiculopathy, lumbar region: Secondary | ICD-10-CM | POA: Diagnosis not present

## 2021-06-06 DIAGNOSIS — M25561 Pain in right knee: Secondary | ICD-10-CM

## 2021-06-06 DIAGNOSIS — G8929 Other chronic pain: Secondary | ICD-10-CM

## 2021-06-06 DIAGNOSIS — M25562 Pain in left knee: Secondary | ICD-10-CM

## 2021-06-06 NOTE — Progress Notes (Signed)
Office Visit Note   Patient: Javier Avila           Date of Birth: 03/21/1956           MRN: [id number] Visit Date: 06/06/2021              Requested by: Juanda Chance, NP [address] [place],  [place] [address] PCP: Alicia Amel, MD   Assessment & Plan: Visit Diagnoses:  1. Chronic pain of both knees   2. Radiculopathy, lumbar region     Plan: Impression is bilateral knee pain with underlying osteoarthritis, but likely symptomatic from his lumbar spine.  We have discussed proceeding with diagnostic and maybe therapeutic knee injection versus waiting to see how he feels following his ESI.  He would like to wait and see how his knees feel after the epidural steroid injection.  Should he still have pain in his knees, he will come in for knee injection.  Otherwise, follow-up with [place] as needed.  Follow-Up Instructions: Return if symptoms worsen or fail to improve.   Orders:  Orders Placed This Encounter  Procedures   XR KNEE 3 VIEW RIGHT   XR KNEE 3 VIEW LEFT   No orders of the defined types were placed in this encounter.     Procedures: No procedures performed   Clinical Data: No additional findings.   Subjective: Chief Complaint  Patient presents with   Left Knee - Pain   Right Knee - Pain    HPI patient is a pleasant 66 year old gentleman who comes in today with bilateral knee pain left greater than right.  He notes intermittent pain for years which is worsened over the past few months.  The pain he has actually starts in his back and radiates down his legs.  Symptoms appear to be worse with stair climbing.  He has been taking Advil with mild relief.  Of note, he also has a history of lumbar pathology with left foraminal and bilateral lateral recess narrowing L3-4, L4-5 and moderate facet hypertrophy L3-4 and mild at L4-5.  He underwent L5-S1 interlaminar ESI back in 2021 which did help his symptoms.  He has been recently seen by Dr. Alvester Morin where an L3  transforaminal ESI has been recommended.  He has not previously had cortisone injections to the knees.  Review of Systems as detailed in HPI.  All others reviewed and are negative.   Objective: Vital Signs: There were no vitals taken for this visit.  Physical Exam well-developed well-nourished gentleman in no acute distress.  Alert and oriented x3.  Ortho Exam bilateral knee exam shows range of motion from 0 to 115 degrees.  No effusion.  No joint line tenderness.  Ligaments are stable.  He is neurovascular intact distally.  Specialty Comments:  No specialty comments available.  Imaging: XR KNEE 3 VIEW LEFT  Result Date: 06/07/2021 Moderate tricompartmental degenerative changes  XR KNEE 3 VIEW RIGHT  Result Date: 06/07/2021 Moderate tricompartmental degenerative changes    PMFS History: Patient Active Problem List   Diagnosis Date Noted   Frequent falls 12/12/2020   Acute lumbar back pain 12/10/2018   Angina pectoris (HCC) 12/10/2018   Cigarette smoker 08/23/2018   COPD GOLD 0/ still smoking  08/22/2018   Dyspnea on exertion 01/27/2018   Health care maintenance 12/05/2017   Hyperlipidemia 12/06/2015   History of hypertension 12/06/2015   Pre-diabetes 12/06/2015   Chronic lower back pain 12/06/2015   Past Medical History:  Diagnosis Date  Arthritis    Falls infrequently 02/28/2018   Heel pain, bilateral 12/06/2015   Hypertension    Lower back pain    Stroke [facility])     Family History  Family history unknown: Yes    History reviewed. No pertinent surgical history. Social History   Occupational History   Not on file  Tobacco Use   Smoking status: Every Day    Packs/day: 0.50    Years: 43.00    Pack years: 21.50    Types: Cigarettes    Start date: 04/30/1974   Smokeless tobacco: Never  Vaping Use   Vaping Use: Never used  Substance and Sexual Activity   Alcohol use: No   Drug use: Not Currently    Comment: About once monthly when in severe pain or needs to  relax   Sexual activity: Not Currently

## 2021-06-19 ENCOUNTER — Encounter: Payer: Self-pay | Admitting: Physical Medicine and Rehabilitation

## 2021-06-19 ENCOUNTER — Other Ambulatory Visit: Payer: Self-pay

## 2021-06-19 ENCOUNTER — Ambulatory Visit: Payer: Self-pay

## 2021-06-19 ENCOUNTER — Ambulatory Visit (INDEPENDENT_AMBULATORY_CARE_PROVIDER_SITE_OTHER): Payer: Medicaid Other | Admitting: Physical Medicine and Rehabilitation

## 2021-06-19 VITALS — BP 108/69 | HR 74

## 2021-06-19 DIAGNOSIS — M5416 Radiculopathy, lumbar region: Secondary | ICD-10-CM

## 2021-06-19 MED ORDER — METHYLPREDNISOLONE ACETATE 80 MG/ML IJ SUSP
80.0000 mg | Freq: Once | INTRAMUSCULAR | Status: AC
  Administered 2021-06-19: 80 mg

## 2021-06-19 NOTE — Patient Instructions (Signed)

## 2021-06-19 NOTE — Progress Notes (Signed)
Pt state Lower back pain that travels to his left knee. Pt state any movement cause pain for his lower back. Pt state he takes over the counter pain meds to help ease his pain.  Numeric Pain Rating Scale and Functional Assessment Average Pain 5   In the last MONTH (on 0-10 scale) has pain interfered with the following?  1. General activity like being  able to carry out your everyday physical activities such as walking, climbing stairs, carrying groceries, or moving a chair?  Rating(10)   +Driver, -BT, -Dye Allergies.

## 2021-06-23 ENCOUNTER — Ambulatory Visit
Admission: RE | Admit: 2021-06-23 | Discharge: 2021-06-23 | Disposition: A | Discharge status: Home / Self Care | Payer: Medicaid Other | Source: Ambulatory Visit | Attending: Physical Medicine and Rehabilitation | Admitting: Physical Medicine and Rehabilitation

## 2021-06-23 ENCOUNTER — Other Ambulatory Visit: Payer: Self-pay

## 2021-06-23 DIAGNOSIS — M542 Cervicalgia: Secondary | ICD-10-CM | POA: Diagnosis not present

## 2021-06-23 DIAGNOSIS — M4802 Spinal stenosis, cervical region: Secondary | ICD-10-CM | POA: Diagnosis not present

## 2021-06-23 IMAGING — MR MR CERVICAL SPINE W/O CM
5 series · 36 of 48 positions shown · non-contrast
Comparison: Cervical radiography [DATE]

CLINICAL DATA: Chronic neck pain with right shoulder and arm pain
for 2 years

EXAM:
MRI CERVICAL SPINE WITHOUT CONTRAST
TECHNIQUE: Multiplanar, multisequence MR imaging of the cervical spine was
performed. No intravenous contrast was administered.

[Series 2: T2 · sagittal · 3.0mm · 0.41mm/px · 8 of 17 slices shown (1 of 2)]
[im 1/17]
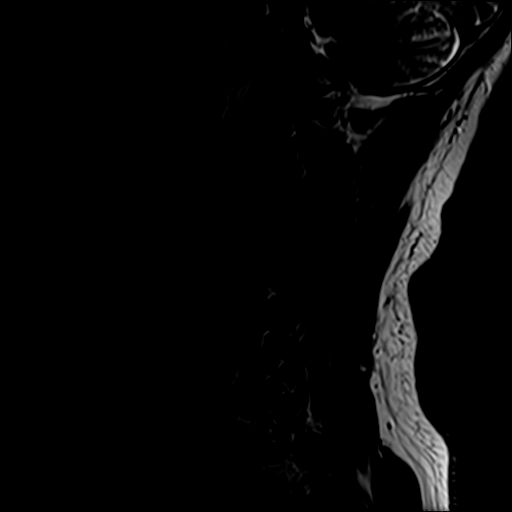
[im 3/17]
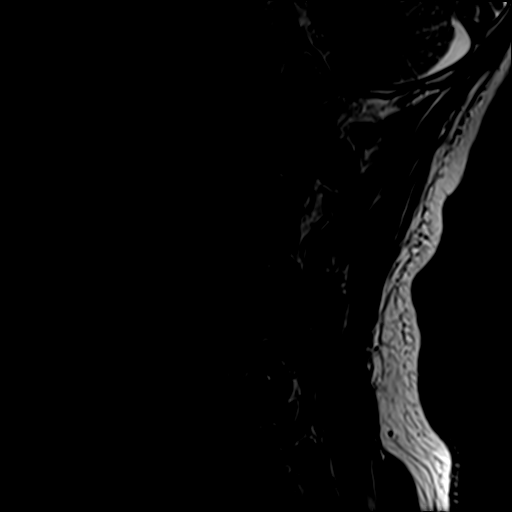
[im 5/17]
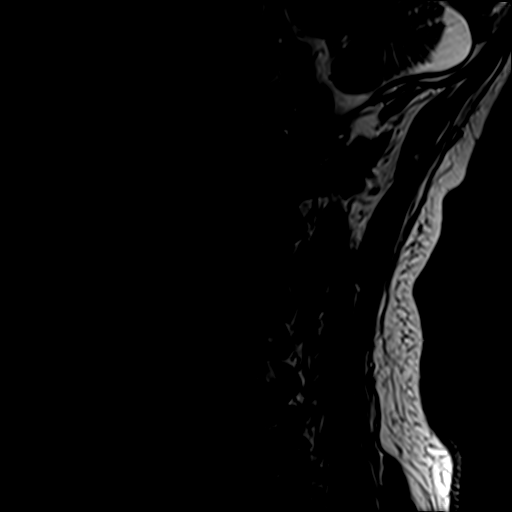
[im 7/17]
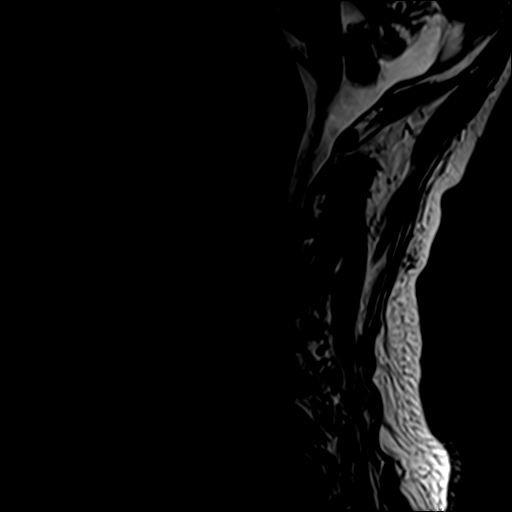
[im 10/17]
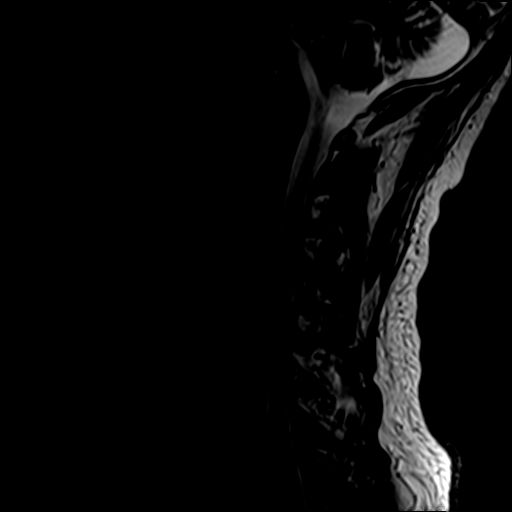
[im 12/17]
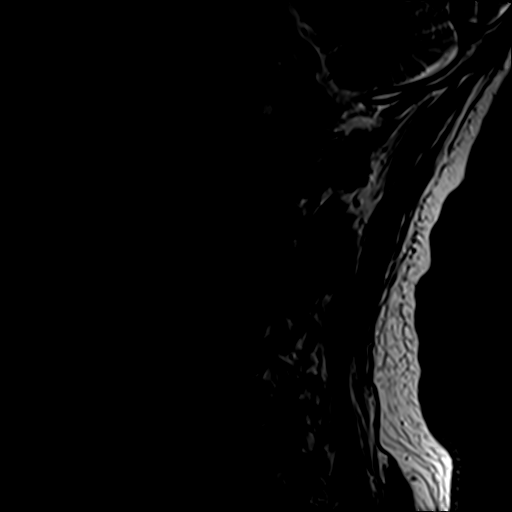
[im 14/17]
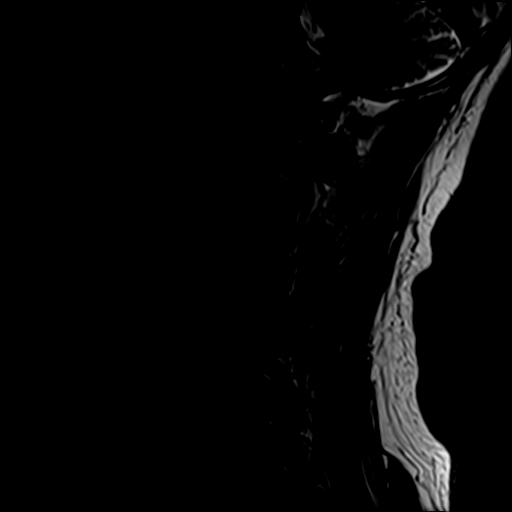
[im 17/17]
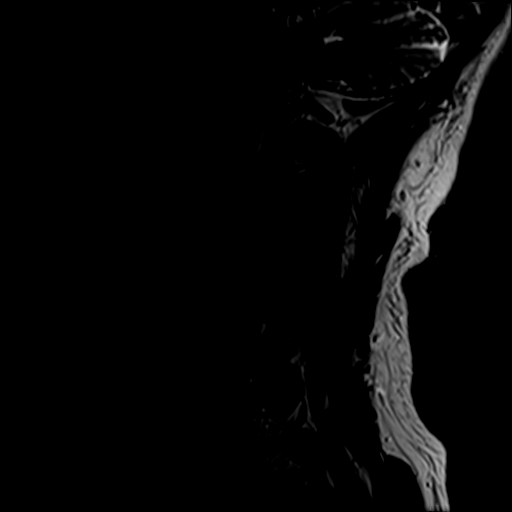

[Series 3: STIR · sagittal · 3.0mm · 0.82mm/px · 8 of 17 slices shown]
[im 1/17]
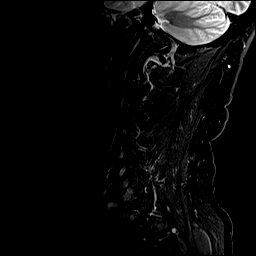
[im 3/17]
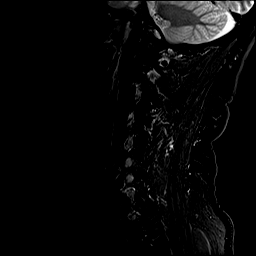
[im 5/17]
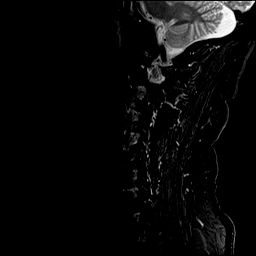
[im 7/17]
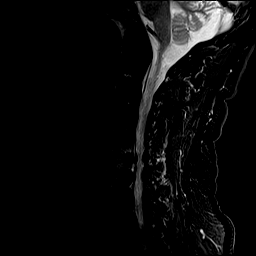
[im 10/17]
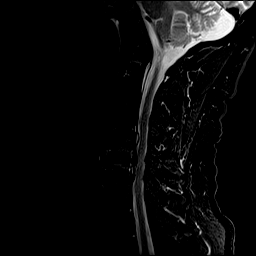
[im 12/17]
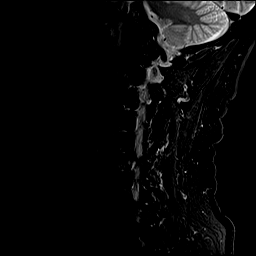
[im 14/17]
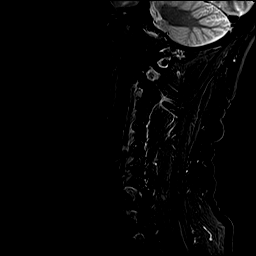
[im 17/17]
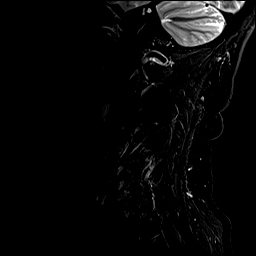

[Series 4: T1 · sagittal · 3.0mm · 0.82mm/px · 8 of 17 slices shown]
[im 1/17]
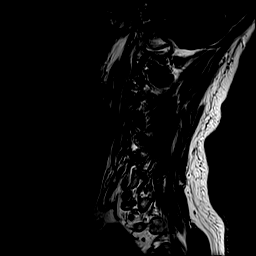
[im 3/17]
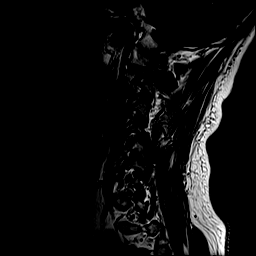
[im 5/17]
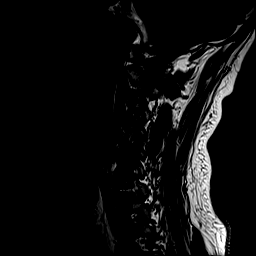
[im 7/17]
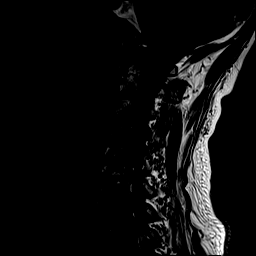
[im 10/17]
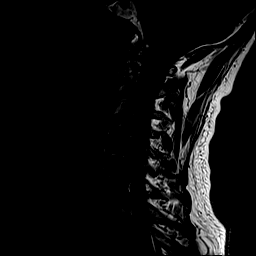
[im 12/17]
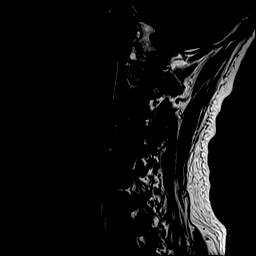
[im 14/17]
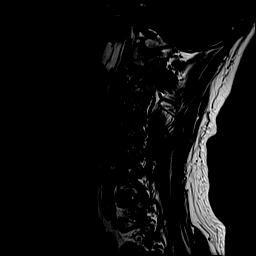
[im 17/17]
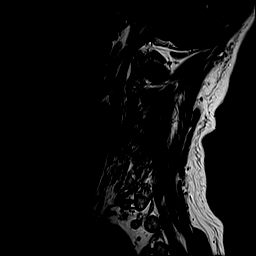

[Series 5: T2 · axial · 3.0mm · 0.70mm/px · z∈[-84,+8]mm · 9 of 26 slices shown (2 of 2)]
[im 1/26]
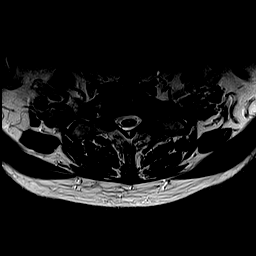
[im 5/26]
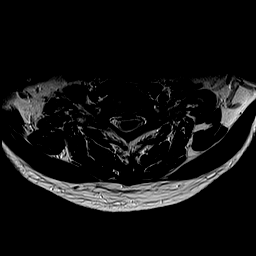
[im 7/26]
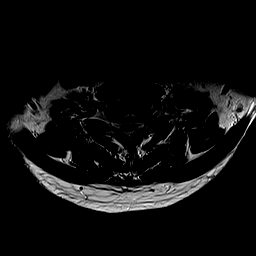
[im 12/26]
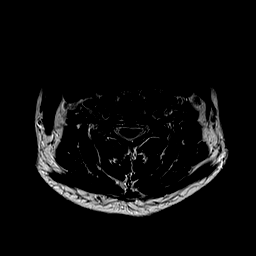
[im 14/26]
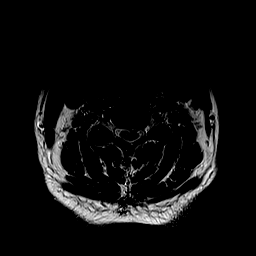
[im 19/26]
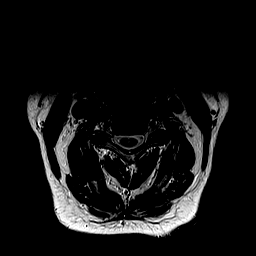
[im 21/26]
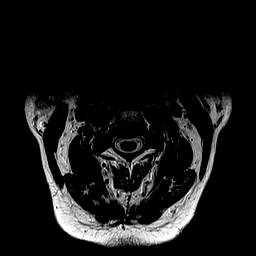
[im 23/26]
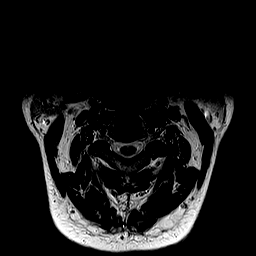
[im 26/26]
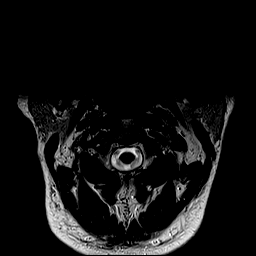

[Series 6: GRE · axial · 3.0mm · 0.35mm/px · z∈[-84,-62]mm · 3 of 26 slices shown]
[im 1/26]
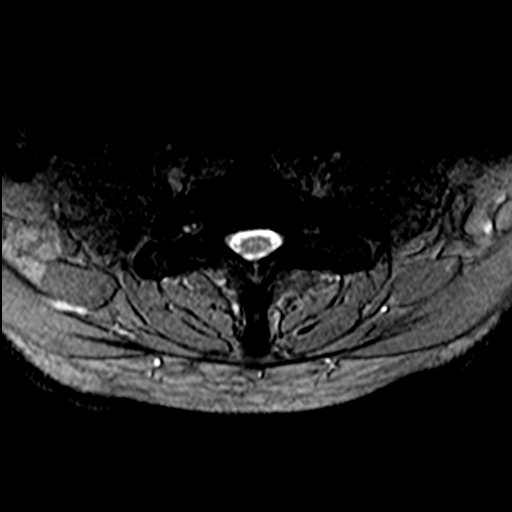
[im 5/26]
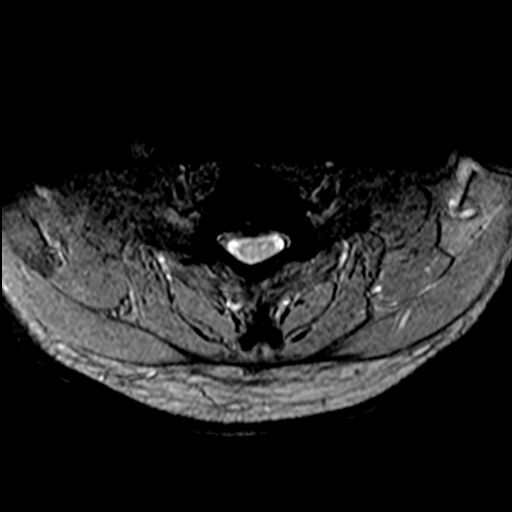
[im 7/26]
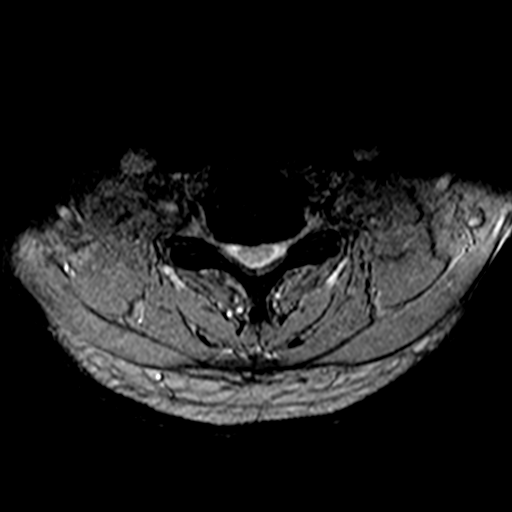

[36 of 48 positions shown; findings below may reference images not displayed]

FINDINGS: Alignment: Physiologic.

Vertebrae: No fracture, evidence of discitis, or bone lesion.

Cord: Degenerative cord flattening at C5-6.  No cord edema

Posterior Fossa, vertebral arteries, paraspinal tissues: Negative.

Disc levels:

C2-3: Unremarkable.

C3-4: Early right uncovertebral ridging

C4-5: Left paracentral protrusion indenting the left ventral cord.
Mild disc space narrowing and bulging

C5-6: Disc collapse with endplate and uncovertebral ridging.
Ligamentum flavum thickening. Spinal stenosis with cord flattening.
Uncovertebral spurs cause biforaminal impingement

C6-7: Disc collapse and endplate degeneration with disc bulging and
ridging. Left more than right foraminal impingement. Spinal stenosis
with mild cord flattening.

C7-T1:Unremarkable.
IMPRESSION: 1. Cervical spine degeneration especially affecting the disc spaces
of C4-5 to C6-7 where there spinal canal narrowing with cord
indentation, cord flattening greatest at C5-6.
2. Biforaminal impingement at C5-6 and C6-7.

## 2021-06-27 ENCOUNTER — Telehealth: Payer: Self-pay | Admitting: Physical Medicine and Rehabilitation

## 2021-06-27 NOTE — Telephone Encounter (Signed)
Patient is calling to schedule an appointment to have his MRI results. He also wants to relay information that he experienced back spasms after receiving an injection. Please advise!

## 2021-06-28 NOTE — Procedures (Signed)
Lumbosacral Transforaminal Epidural Steroid Injection - Sub-Pedicular Approach with Fluoroscopic Guidance  Patient: Javier Avila      Date of Birth: Mar 03, 1956 MRN: [id number] PCP: Alicia Amel, MD      Visit Date: 06/19/2021   Universal Protocol:    Date/Time: 06/19/2021  Consent Given By: the patient  Position: PRONE  Additional Comments: Vital signs were monitored before and after the procedure. Patient was prepped and draped in the usual sterile fashion. The correct patient, procedure, and site was verified.   Injection Procedure Details:   Procedure diagnoses: Lumbar radiculopathy [M54.16]    Meds Administered:  Meds ordered this encounter  Medications   methylPREDNISolone acetate (DEPO-MEDROL) injection 80 mg    Laterality: Bilateral  Location/Site: L3  Needle:5.0 in., 22 ga.  Short bevel or Quincke spinal needle  Needle Placement: Transforaminal  Findings:    -Comments: Excellent flow of contrast along the nerve, nerve root and into the epidural space.  Procedure Details: After squaring off the end-plates to get a true AP view, the C-arm was positioned so that an oblique view of the foramen as noted above was visualized. The target area is just inferior to the "nose of the scotty dog" or sub pedicular. The soft tissues overlying this structure were infiltrated with 2-3 ml. of 1% Lidocaine without Epinephrine.  The spinal needle was inserted toward the target using a "trajectory" view along the fluoroscope beam.  Under AP and lateral visualization, the needle was advanced so it did not puncture dura and was located close the 6 O'Clock position of the pedical in AP tracterory. Biplanar projections were used to confirm position. Aspiration was confirmed to be negative for CSF and/or blood. A 1-2 ml. volume of Isovue-250 was injected and flow of contrast was noted at each level. Radiographs were obtained for documentation purposes.   After attaining the desired  flow of contrast documented above, a 0.5 to 1.0 ml test dose of 0.25% Marcaine was injected into each respective transforaminal space.  The patient was observed for 90 seconds post injection.  After no sensory deficits were reported, and normal lower extremity motor function was noted,   the above injectate was administered so that equal amounts of the injectate were placed at each foramen (level) into the transforaminal epidural space.   Additional Comments:  The patient tolerated the procedure well Dressing: 2 x 2 sterile gauze and Band-Aid    Post-procedure details: Patient was observed during the procedure. Post-procedure instructions were reviewed.  Patient left the clinic in stable condition.

## 2021-06-28 NOTE — Progress Notes (Signed)
Moriah Loughry - 66 y.o. male MRN [id number]  Date of birth: 09/13/1955  Office Visit Note: Visit Date: 06/19/2021 PCP: Alicia Amel, MD Referred by: Alicia Amel, MD  Subjective: Chief Complaint  Patient presents with   Lower Back - Pain   Left Knee - Pain   HPI:  Shawnta Zimbelman is a 66 y.o. male who comes in today at the request of Ellin Goodie, FNP for planned Bilateral L3-4 Lumbar Transforaminal epidural steroid injection with fluoroscopic guidance.  The patient has failed conservative care including home exercise, medications, time and activity modification.  This injection will be diagnostic and hopefully therapeutic.  Please see requesting physician notes for further details and justification.  ROS Otherwise per HPI.  Assessment & Plan: Visit Diagnoses:    ICD-10-CM   1. Lumbar radiculopathy  M54.16 XR C-ARM NO REPORT    Epidural Steroid injection    methylPREDNISolone acetate (DEPO-MEDROL) injection 80 mg      Plan: No additional findings.   Meds & Orders:  Meds ordered this encounter  Medications   methylPREDNISolone acetate (DEPO-MEDROL) injection 80 mg    Orders Placed This Encounter  Procedures   XR C-ARM NO REPORT   Epidural Steroid injection    Follow-up: No follow-ups on file.   Procedures: No procedures performed  Lumbosacral Transforaminal Epidural Steroid Injection - Sub-Pedicular Approach with Fluoroscopic Guidance  Patient: Javier Avila      Date of Birth: 05/25/1955 MRN: [id number] PCP: Alicia Amel, MD      Visit Date: 06/19/2021   Universal Protocol:    Date/Time: 06/19/2021  Consent Given By: the patient  Position: PRONE  Additional Comments: Vital signs were monitored before and after the procedure. Patient was prepped and draped in the usual sterile fashion. The correct patient, procedure, and site was verified.   Injection Procedure Details:   Procedure diagnoses: Lumbar radiculopathy [M54.16]    Meds Administered:   Meds ordered this encounter  Medications   methylPREDNISolone acetate (DEPO-MEDROL) injection 80 mg    Laterality: Bilateral  Location/Site: L3  Needle:5.0 in., 22 ga.  Short bevel or Quincke spinal needle  Needle Placement: Transforaminal  Findings:    -Comments: Excellent flow of contrast along the nerve, nerve root and into the epidural space.  Procedure Details: After squaring off the end-plates to get a true AP view, the C-arm was positioned so that an oblique view of the foramen as noted above was visualized. The target area is just inferior to the "nose of the scotty dog" or sub pedicular. The soft tissues overlying this structure were infiltrated with 2-3 ml. of 1% Lidocaine without Epinephrine.  The spinal needle was inserted toward the target using a "trajectory" view along the fluoroscope beam.  Under AP and lateral visualization, the needle was advanced so it did not puncture dura and was located close the 6 O'Clock position of the pedical in AP tracterory. Biplanar projections were used to confirm position. Aspiration was confirmed to be negative for CSF and/or blood. A 1-2 ml. volume of Isovue-250 was injected and flow of contrast was noted at each level. Radiographs were obtained for documentation purposes.   After attaining the desired flow of contrast documented above, a 0.5 to 1.0 ml test dose of 0.25% Marcaine was injected into each respective transforaminal space.  The patient was observed for 90 seconds post injection.  After no sensory deficits were reported, and normal lower extremity motor function was noted,   the above injectate was administered  so that equal amounts of the injectate were placed at each foramen (level) into the transforaminal epidural space.   Additional Comments:  The patient tolerated the procedure well Dressing: 2 x 2 sterile gauze and Band-Aid    Post-procedure details: Patient was observed during the procedure. Post-procedure  instructions were reviewed.  Patient left the clinic in stable condition.     Clinical History: MRI LUMBAR SPINE WITHOUT CONTRAST   TECHNIQUE: Multiplanar, multisequence MR imaging of the lumbar spine was performed. No intravenous contrast was administered.   COMPARISON:  None.   FINDINGS: Segmentation:  Standard.   Alignment:  Mild dextrocurvature.  Otherwise physiologic.   Vertebrae: No acute fracture or suspicious osseous lesion. Congenitally short pedicles, which narrow the AP diameter of the spinal canal.   Conus medullaris and cauda equina: Conus extends to the L1 level. Conus and cauda equina appear normal.   Paraspinal and other soft tissues: Negative.   Disc levels:   T12-L1: No significant disc bulge. No spinal canal stenosis or neural foraminal narrowing.   L1-L2: No significant disc bulge. No spinal canal stenosis or neural foraminal narrowing.   L2-L3: Mild disc bulge, with small right foraminal protrusion. Mild facet arthropathy. No spinal canal stenosis or neural foraminal narrowing.   L3-L4: Disc height loss and moderate disc bulge with central annular fissure. Moderate facet arthropathy. Narrowing of the lateral recesses. No spinal canal stenosis. Mild left neural foraminal narrowing.   L4-L5: Moderate disc bulge. Mild facet arthropathy. Narrowing of the lateral recesses. No spinal canal stenosis. Mild right neural foraminal narrowing.   L5-S1: Disc bulge, somewhat eccentric to the right. Mild facet arthropathy. No spinal canal stenosis or neural foraminal narrowing.   IMPRESSION: 1. L3-L4 mild left neural foraminal narrowing. Narrowing of the lateral recesses at this level could affect the descending L4 nerve roots. 2. L4-L5 mild right neural foraminal narrowing. Narrowing of the lateral recesses at this level could affect the descending L5 nerve roots. 3. No spinal canal stenosis.     Electronically Signed   By: Wiliam Ke M.D.    On: 05/23/2021 03:05 ----  12/14/2019 lumbar x-rays  X-rays lumbar spine reveal moderate degenerative disc disease at L4-5 and  L5-S1.  Alignment is otherwise anatomic, no sign of neoplasm or  compression fracture.  Hip joints have mild degenerative change. Lavada Mesi, MD     Objective:  VS:  HT:     WT:    BMI:      BP:108/69   HR:74bpm   TEMP: ( )   RESP:  Physical Exam Vitals and nursing note reviewed.  Constitutional:      General: He is not in acute distress.    Appearance: Normal appearance. He is not ill-appearing.  HENT:     Head: Normocephalic and atraumatic.     Right Ear: External ear normal.     Left Ear: External ear normal.     Nose: No congestion.  Eyes:     Extraocular Movements: Extraocular movements intact.  Cardiovascular:     Rate and Rhythm: Normal rate.     Pulses: Normal pulses.  Pulmonary:     Effort: Pulmonary effort is normal. No respiratory distress.  Abdominal:     General: There is no distension.     Palpations: Abdomen is soft.  Musculoskeletal:        General: No tenderness or signs of injury.     Cervical back: Neck supple.     Right lower leg: No edema.  Left lower leg: No edema.     Comments: Patient has good distal strength without clonus.  Skin:    Findings: No erythema or rash.  Neurological:     General: No focal deficit present.     Mental Status: He is alert and oriented to person, place, and time.     Sensory: No sensory deficit.     Motor: No weakness or abnormal muscle tone.     Coordination: Coordination normal.  Psychiatric:        Mood and Affect: Mood normal.        Behavior: Behavior normal.     Imaging: No results found.

## 2021-06-29 ENCOUNTER — Other Ambulatory Visit: Payer: Self-pay

## 2021-06-29 ENCOUNTER — Ambulatory Visit (INDEPENDENT_AMBULATORY_CARE_PROVIDER_SITE_OTHER): Payer: Medicaid Other | Admitting: Family Medicine

## 2021-06-29 ENCOUNTER — Encounter: Payer: Self-pay | Admitting: Family Medicine

## 2021-06-29 VITALS — BP 130/74 | HR 66 | Ht 76.0 in | Wt 211.0 lb

## 2021-06-29 DIAGNOSIS — Z1211 Encounter for screening for malignant neoplasm of colon: Secondary | ICD-10-CM | POA: Diagnosis not present

## 2021-06-29 DIAGNOSIS — M5416 Radiculopathy, lumbar region: Secondary | ICD-10-CM | POA: Diagnosis not present

## 2021-06-29 DIAGNOSIS — M545 Low back pain, unspecified: Secondary | ICD-10-CM | POA: Diagnosis not present

## 2021-06-29 DIAGNOSIS — R296 Repeated falls: Secondary | ICD-10-CM

## 2021-06-29 DIAGNOSIS — G8929 Other chronic pain: Secondary | ICD-10-CM

## 2021-06-29 NOTE — Progress Notes (Signed)
? ? ?  SUBJECTIVE:  ? ?CHIEF COMPLAINT / HPI:  ? ?Low back pain, ambulation concerns ?- he is hoping for motorized wheelchair for running errands ?- concerned about painful ambulation and feeling off balance because of back spasms ?- PMH lumbar radiculopathy (sees Dr. Alvester Morin of [facility]) and bilateral knee pain (sees Dr. Roda Shutters, orthopedics) ?- last lumbosacral injection 06/19/21 with Dr. Alvester Morin ?- previously saw PT ?- taking aleve liquid gel and topical OTC preparations without much relief ?- does have medications prescribed but is out and cannot remember names ?- has a cane but won't use it because it's a single leg and he falls with it ?- reports previously tried a walker and rolling walker but doesn't like because leaning forward is painful ?- denies focal leg weakness, legs giving out, numbness, tingling, or sharp radiating leg pain ?- no saddle anesthesia ? ?PERTINENT  PMH / PSH:  ?Patient Active Problem List  ? Diagnosis Date Noted  ? Frequent falls 12/12/2020  ? Acute lumbar back pain 12/10/2018  ? Angina pectoris (HCC) 12/10/2018  ? Cigarette smoker 08/23/2018  ? COPD GOLD 0/ still smoking  08/22/2018  ? Dyspnea on exertion 01/27/2018  ? Health care maintenance 12/05/2017  ? Hyperlipidemia 12/06/2015  ? History of hypertension 12/06/2015  ? Pre-diabetes 12/06/2015  ? Chronic lower back pain 12/06/2015  ?  ?OBJECTIVE:  ?BP 130/74   Pulse 66   Ht 6\' 4"  (1.93 m)   Wt 211 lb (95.7 kg)   BMI 25.68 kg/m?   ? ?PHQ-9:  ?Depression screen [facility] 2/9 06/29/2021 02/09/2021 12/12/2020  ?Decreased Interest - 1 0  ?Down, Depressed, Hopeless - 1 1  ?PHQ - 2 Score - 2 1  ?Altered sleeping - 0 1  ?Tired, decreased energy - 1 2  ?Change in appetite - 1 1  ?Feeling bad or failure about yourself  - 1 0  ?Trouble concentrating - 1 0  ?Moving slowly or fidgety/restless - 0 1  ?Suicidal thoughts 0 0 0  ?PHQ-9 Score - 6 6  ?Difficult doing work/chores - - Somewhat difficult  ?  ?GAD-7: No flowsheet data  found. ? ?Physical Exam ?General: Awake, alert, oriented ?Respiratory: normal respiratory effort, no SOB ?MSK: Global TTP over lumbar spine and paraspinal musculature, no ecchymoses/swelling/warmth overlying tender areas ?Extremities: No bilateral lower extremity edema, palpable pedal and pretibial pulses bilaterally, BLE strength 5/5 and equal bilaterally with flexion and extension ?Neuro: Cranial nerves II through X grossly intact, able to move all extremities spontaneously ? ?ASSESSMENT/PLAN:  ? ?Chronic lower back pain ?Stable, no changes in quality or location of pain. No red flags. Patient today most concerned about lumbar pain with ambulation and requests motorized scooter or wheelchair to assist when running errands. Discussed that he does not qualify for a motorized chair given that he is still ambulatory. Would start with ambulatory aide such as four-footed cane or rolling walker that is sized appropriately for height (given bending forward is painful). Will refer back to PT for this. Discussed importance of keeping him ambulatory for as long as possible. Recommended conservative management and strengthening of other muscle groups. See AVS for more.  ?  ? ?12/14/2020, MD ?[facility]  ?

## 2021-06-29 NOTE — Patient Instructions (Addendum)
It was wonderful to meet you today. Thank you for allowing me to be a part of your care. Below is a short summary of what we discussed at your visit today: ? ?Back pain, mobility aid ?Because you are still ambulatory, you do not qualify for a motorized cart.  ?I suggest you try to use other mobility aids for running errands such as four-legged canes or walkers built for tall people (since bending forward is painful).  ?I will send you back to physical therapy for improvement of your mobility and fitting for am ambulatory aid! ?Keep trying ice, heat, tylenol, ibuprofen, and aleve for your muscle spasm symptoms.  ?Regular message also greatly helps muscle discomfort and knots.  ?Routine stretching and increased mobility work well to improve back pain. We highly recommend programs such as Tai Chi and gentle yoga.  ? ? ?Please bring all of your medications to every appointment! ? ?If you have any questions or concerns, please do not hesitate to contact us via phone or MyChart message.  ? ?Ezequiel Essex, MD  ?

## 2021-06-29 NOTE — Assessment & Plan Note (Addendum)
Stable, no changes in quality or location of pain. No red flags. Patient today most concerned about lumbar pain with ambulation and requests motorized scooter or wheelchair to assist when running errands. Discussed that he does not qualify for a motorized chair given that he is still ambulatory. Would start with ambulatory aide such as four-footed cane or rolling walker that is sized appropriately for height (given bending forward is painful). Will refer back to PT for this. Discussed importance of keeping him ambulatory for as long as possible. Recommended conservative management and strengthening of other muscle groups. See AVS for more.  ?

## 2021-07-04 ENCOUNTER — Encounter: Payer: Self-pay | Admitting: Physical Medicine and Rehabilitation

## 2021-07-04 ENCOUNTER — Ambulatory Visit (INDEPENDENT_AMBULATORY_CARE_PROVIDER_SITE_OTHER): Payer: Medicaid Other | Admitting: Physical Medicine and Rehabilitation

## 2021-07-04 ENCOUNTER — Other Ambulatory Visit: Payer: Self-pay

## 2021-07-04 VITALS — BP 153/97 | HR 69

## 2021-07-04 DIAGNOSIS — M25561 Pain in right knee: Secondary | ICD-10-CM | POA: Diagnosis not present

## 2021-07-04 DIAGNOSIS — M48061 Spinal stenosis, lumbar region without neurogenic claudication: Secondary | ICD-10-CM

## 2021-07-04 DIAGNOSIS — G8929 Other chronic pain: Secondary | ICD-10-CM | POA: Diagnosis not present

## 2021-07-04 DIAGNOSIS — M25562 Pain in left knee: Secondary | ICD-10-CM

## 2021-07-04 DIAGNOSIS — M542 Cervicalgia: Secondary | ICD-10-CM

## 2021-07-04 DIAGNOSIS — M5412 Radiculopathy, cervical region: Secondary | ICD-10-CM

## 2021-07-04 DIAGNOSIS — M47816 Spondylosis without myelopathy or radiculopathy, lumbar region: Secondary | ICD-10-CM

## 2021-07-04 NOTE — Progress Notes (Signed)
Pt state neck pain that travels to his left shoulder. Pt state any movement can cause the pain the get worse. Pt state he takes pain meds to help ease his pain. ? ?Pt state he has pain in his lower back pain that travels behind his right knee. ? ?Pt has hx of lower back inj on 06/19/21 pt state it didn't help. ? ?Numeric Pain Rating Scale and Functional Assessment ?Average Pain 10 ?Pain Right Now 9 ?My pain is constant, sharp, stabbing, and aching ?Pain is worse with: walking, bending, sitting, standing, and some activites ?Pain improves with: medication ? ? ?In the last MONTH (on 0-10 scale) has pain interfered with the following? ? ?1. General activity like being  able to carry out your everyday physical activities such as walking, climbing stairs, carrying groceries, or moving a chair?  ?Rating(7) ? ?2. Relation with others like being able to carry out your usual social activities and roles such as  activities at home, at work and in your community. ?Rating(8) ? ?3. Enjoyment of life such that you have  been bothered by emotional problems such as feeling anxious, depressed or irritable?  ?Rating(9) ? ? ?

## 2021-07-04 NOTE — Progress Notes (Signed)
Javier Avila - 66 y.o. male MRN [id number]  Date of birth: February 20, 1956  Office Visit Note: Visit Date: 07/04/2021 PCP: Eppie Gibson, MD Referred by: Eppie Gibson, MD  Subjective: Chief Complaint  Patient presents with   Neck - Pain   Left Shoulder - Pain   Lower Back - Pain   Right Knee - Pain   HPI: Javier Avila is a 66 y.o. male who comes in today for evaluation of chronic, worsening and severe bilateral neck pain radiating to bilateral shoulders and down left arm to fingers. He also reports continued bilateral lower back pain. Patient reports pain has been ongoing for several years. He states pain is exacerbated by movement and activity, describes as a constant sore sensation, currently rates as 9 out of 10. Patient reports some relief of pain with home exercise regimen, rest and use of medications. Patients recent cervical MRI exhibits degeneration affecting the disc spaces of C4-C5 to C6-C7 where there spinal canal narrowing with cord indentation, cord flattening greatest at C5-C6. Patient states he has been dealing with severe neck pain for several years and reports difficulty sleeping due to increased discomfort.   Chronic, worsening and severe bilateral lower back pain. Patient reports pain is exacerbated by prolonged sitting, describes pain as constant throbbing and sore sensation, currently rates as 7 out of 10. Patient reports some relief of pain with home exercise regimen, rest and medications. Patient attended formal physical therapy in 2022 at  [facility] and reports no relief of pain with these treatments. Patients recent lumbar MRI exhibits mild left foraminal and bilateral lateral recess narrowing at L3-L4 that could be affecting the descending L4 nerve roots. There is also mild right foraminal and bilateral lateral recess narrowing at L4-L5 that could be affecting the descending L5 nerve roots.  There is moderate facet hypertrophy noted at  L3-L4 and mild at L4-L5. No high-grade spinal canal stenosis noted. Patient recently had bilateral L3 transforaminal epidural steroid injections in our office and reports some relief of pain for 3 days, however he now currently denies radicular leg symptoms and states lower back pain persists. Patient does reports continued diffuse back pain and muscle cramping. Patient denies focal weakness, numbness and tingling. Patient denies recent trauma or falls.   Patient was recently evaluated by Dr. Eduard Roux for chronic bilateral knee pain and per his notes if patient continues to have knee pain after intervention with lumbar epidural steroid injections he would consider performing knee injections. Patient states his bilateral knee pain has improved some, however he continues to have knee discomfort especially with walking and prolonged standing.  Review of Systems  Musculoskeletal:  Positive for back pain, joint pain, myalgias and neck pain.  Neurological:  Negative for tingling, sensory change, focal weakness and weakness.  All other systems reviewed and are negative. Otherwise per HPI.  Assessment & Plan: Visit Diagnoses:    ICD-10-CM   1. Radiculopathy, cervical region  M54.12 Ambulatory referral to Physical Medicine Rehab    2. Cervicalgia  M54.2     3. Spondylosis without myelopathy or radiculopathy, lumbar region  M47.816     4. Foraminal stenosis of lumbar region  M48.061     5. Facet hypertrophy of lumbar region  M47.816     6. Chronic pain of both knees  M25.561    M25.562    G89.29        Plan: Findings:  1.  Chronic, worsening and severe bilateral neck  pain radiating to bilateral shoulders and down left arm to fingers.  Patient continues to have excruciating and debilitating pain despite good conservative therapy such as home exercise regimen, rest and use of medications. Patients clinical presentation and exam are consistent with C5/C6 nerve pattern. We feel the next step is to  perform a diagnostic and hopefully therapeutic left C7-T1 interlaminar epidural steroid injection under fluoroscopic guidance. Patient is not currently on long term anticoagulant therapy. We did speak with him about consulting with neurosurgeon and feel this would be beneficial to gather information and discuss treatment options. Patient would like to proceed with cervical epidural steroid injection at this time.   2. Chronic, worsening and severe bilateral lower back pain.  Bilateral radicular leg symptoms have resolved since recent epidural injection, however lower back pain persists. Patient continues to have severe pain despite good conservative therapies such as formal physical therapy, home exercise regimen, rest and use of medications. We did discuss facet joint blocks and possibility of longer sustained pain relief with radiofrequency ablation. We also discussed medication management today and briefly talking about Cymbalta. Patient would like to focus on neck issues at this time and will let us know if he would like to proceed with facet joint blocks.   3.  Chronic bilateral knee pain. Some improvement in bilateral knee pain with lumbar epidural steroid injection. Patient instructed to let us know if his knee pain worsens as he would need to follow back up with Dr. Erlinda Hong for further evaluation and possible injections.   No red flag symptoms noted upon exam today.    Meds & Orders: No orders of the defined types were placed in this encounter.   Orders Placed This Encounter  Procedures   Ambulatory referral to Physical Medicine Rehab    Follow-up: Return for Left C7-T1 interlaminar epidural steroid injection.   Procedures: No procedures performed      Clinical History: MRI LUMBAR SPINE WITHOUT CONTRAST  FINDINGS: Segmentation:  Standard.   Alignment:  Mild dextrocurvature.  Otherwise physiologic.   Vertebrae: No acute fracture or suspicious osseous lesion. Congenitally short  pedicles, which narrow the AP diameter of the spinal canal.   Conus medullaris and cauda equina: Conus extends to the L1 level. Conus and cauda equina appear normal.   Paraspinal and other soft tissues: Negative.   Disc levels:   T12-L1: No significant disc bulge. No spinal canal stenosis or neural foraminal narrowing.   L1-L2: No significant disc bulge. No spinal canal stenosis or neural foraminal narrowing.   L2-L3: Mild disc bulge, with small right foraminal protrusion. Mild facet arthropathy. No spinal canal stenosis or neural foraminal narrowing.   L3-L4: Disc height loss and moderate disc bulge with central annular fissure. Moderate facet arthropathy. Narrowing of the lateral recesses. No spinal canal stenosis. Mild left neural foraminal narrowing.   L4-L5: Moderate disc bulge. Mild facet arthropathy. Narrowing of the lateral recesses. No spinal canal stenosis. Mild right neural foraminal narrowing.   L5-S1: Disc bulge, somewhat eccentric to the right. Mild facet arthropathy. No spinal canal stenosis or neural foraminal narrowing.   IMPRESSION: 1. L3-L4 mild left neural foraminal narrowing. Narrowing of the lateral recesses at this level could affect the descending L4 nerve roots. 2. L4-L5 mild right neural foraminal narrowing. Narrowing of the lateral recesses at this level could affect the descending L5 nerve roots. 3. No spinal canal stenosis.  05/23/2021 03:05 ----  EXAM: MRI CERVICAL SPINE WITHOUT CONTRAST   TECHNIQUE: Multiplanar, multisequence MR imaging  of the cervical spine was performed. No intravenous contrast was administered.   COMPARISON:  Cervical radiography 12/14/2019   FINDINGS: Alignment: Physiologic.   Vertebrae: No fracture, evidence of discitis, or bone lesion.   Cord: Degenerative cord flattening at C5-6.  No cord edema   Posterior Fossa, vertebral arteries, paraspinal tissues: Negative.   Disc levels:   C2-3:  Unremarkable.   C3-4: Early right uncovertebral ridging   C4-5: Left paracentral protrusion indenting the left ventral cord. Mild disc space narrowing and bulging   C5-6: Disc collapse with endplate and uncovertebral ridging. Ligamentum flavum thickening. Spinal stenosis with cord flattening. Uncovertebral spurs cause biforaminal impingement   C6-7: Disc collapse and endplate degeneration with disc bulging and ridging. Left more than right foraminal impingement. Spinal stenosis with mild cord flattening.   C7-T1:Unremarkable.   IMPRESSION: 1. Cervical spine degeneration especially affecting the disc spaces of C4-5 to C6-7 where there spinal canal narrowing with cord indentation, cord flattening greatest at C5-6. 2. Biforaminal impingement at C5-6 and C6-7.     Electronically Signed   By: Jorje Guild M.D.   On: 06/24/2021 05:26   He reports that he has been smoking cigarettes. He started smoking about 47 years ago. He has a 21.50 pack-year smoking history. He has never used smokeless tobacco. No results for input(s): HGBA1C, LABURIC in the last 8760 hours.  Objective:  VS:  HT:     WT:    BMI:      BP:(!) 153/97   HR:69bpm   TEMP: ( )   RESP:  Physical Exam Vitals and nursing note reviewed.  HENT:     Head: Normocephalic and atraumatic.     Right Ear: External ear normal.     Left Ear: External ear normal.     Nose: Nose normal.     Mouth/Throat:     Mouth: Mucous membranes are moist.  Eyes:     Extraocular Movements: Extraocular movements intact.  Cardiovascular:     Rate and Rhythm: Normal rate.     Pulses: Normal pulses.  Pulmonary:     Effort: Pulmonary effort is normal.  Abdominal:     General: Abdomen is flat. There is no distension.  Musculoskeletal:        General: Tenderness present.     Cervical back: Tenderness present.     Comments: Pt is slow to rise from seated position to standing. Concordant low back pain with facet loading, lumbar spine  extension and rotation. Strong distal strength without clonus, no pain upon palpation of greater trochanters. Sensation intact bilaterally. Walks independently, gait steady.    Discomfort noted with flexion, extension and side-to-side rotation. Patient has good strength in the upper extremities including 5 out of 5 strength in wrist extension, long finger flexion and APB. There is no atrophy of the hands intrinsically. Dysesthesias noted to left C5/C6 dermatomes. Sensation intact bilaterally. Negative Hoffman's sign.     Skin:    General: Skin is warm and dry.     Capillary Refill: Capillary refill takes less than 2 seconds.  Neurological:     General: No focal deficit present.     Mental Status: He is alert and oriented to person, place, and time.  Psychiatric:        Mood and Affect: Mood normal.        Behavior: Behavior normal.    Ortho Exam  Imaging: No results found.  Past Medical/Family/Surgical/Social History: Medications & Allergies reviewed per EMR, new medications updated. Patient  Active Problem List   Diagnosis Date Noted   Frequent falls 12/12/2020   Acute lumbar back pain 12/10/2018   Angina pectoris ([place]) 12/10/2018   Cigarette smoker 08/23/2018   COPD GOLD 0/ still smoking  08/22/2018   Dyspnea on exertion 01/27/2018   Health care maintenance 12/05/2017   Hyperlipidemia 12/06/2015   History of hypertension 12/06/2015   Pre-diabetes 12/06/2015   Chronic lower back pain 12/06/2015   Past Medical History:  Diagnosis Date   Arthritis    Falls infrequently 02/28/2018   Heel pain, bilateral 12/06/2015   Hypertension    Lower back pain    Stroke [facility])    Family History  Family history unknown: Yes   History reviewed. No pertinent surgical history. Social History   Occupational History   Not on file  Tobacco Use   Smoking status: Every Day    Packs/day: 0.50    Years: 43.00    Pack years: 21.50    Types: Cigarettes    Start date: 04/30/1974   Smokeless  tobacco: Never  Vaping Use   Vaping Use: Never used  Substance and Sexual Activity   Alcohol use: No   Drug use: Not Currently    Comment: About once monthly when in severe pain or needs to relax   Sexual activity: Not Currently

## 2021-08-14 ENCOUNTER — Encounter: Payer: Self-pay | Admitting: Physical Medicine and Rehabilitation

## 2021-08-14 ENCOUNTER — Ambulatory Visit (INDEPENDENT_AMBULATORY_CARE_PROVIDER_SITE_OTHER): Payer: Medicaid Other | Admitting: Physical Medicine and Rehabilitation

## 2021-08-14 ENCOUNTER — Ambulatory Visit: Payer: Self-pay

## 2021-08-14 DIAGNOSIS — M5412 Radiculopathy, cervical region: Secondary | ICD-10-CM | POA: Diagnosis not present

## 2021-08-14 MED ORDER — METHYLPREDNISOLONE ACETATE 80 MG/ML IJ SUSP
80.0000 mg | Freq: Once | INTRAMUSCULAR | Status: AC
  Administered 2021-08-14: 80 mg

## 2021-08-14 NOTE — Patient Instructions (Signed)

## 2021-08-14 NOTE — Progress Notes (Signed)
Pt state neck pain that travels to his left shoulder. Pt state any movement can cause the pain the get worse. Pt state he takes pain meds to help ease his pain. ? ?Numeric Pain Rating Scale and Functional Assessment ?Average Pain 7 ? ? ?In the last MONTH (on 0-10 scale) has pain interfered with the following? ? ?1. General activity like being  able to carry out your everyday physical activities such as walking, climbing stairs, carrying groceries, or moving a chair?  ?Rating(10) ? ? ?+Driver, -BT, -Dye Allergies. ? ?

## 2021-08-23 NOTE — Progress Notes (Signed)
? ?Javier Avila - 66 y.o. male MRN [id number]  Date of birth: 1955/05/17 ? ?Office Visit Note: ?Visit Date: 08/14/2021 ?PCP: Alicia Amel, MD ?Referred by: Alicia Amel, MD ? ?Subjective: ?Chief Complaint  ?Patient presents with  ? Neck - Pain  ? Left Shoulder - Pain  ? ?HPI:  Javier Avila is a 66 y.o. male who comes in today at the request of Ellin Goodie, FNP for planned Left C7-T1 Cervical Interlaminar epidural steroid injection with fluoroscopic guidance.  The patient has failed conservative care including home exercise, medications, time and activity modification.  This injection will be diagnostic and hopefully therapeutic.  Please see requesting physician notes for further details and justification. MRI reviewed with images and spine model.  MRI reviewed in the note below.  ? ?ROS Otherwise per HPI. ? ?Assessment & Plan: ?Visit Diagnoses:  ?  ICD-10-CM   ?1. Radiculopathy, cervical region  M54.12 XR C-ARM NO REPORT  ?  Epidural Steroid injection  ?  methylPREDNISolone acetate (DEPO-MEDROL) injection 80 mg  ?  ?  ?Plan: No additional findings.  ? ?Meds & Orders:  ?Meds ordered this encounter  ?Medications  ? methylPREDNISolone acetate (DEPO-MEDROL) injection 80 mg  ?  ?Orders Placed This Encounter  ?Procedures  ? XR C-ARM NO REPORT  ? Epidural Steroid injection  ?  ?Follow-up: Return if symptoms worsen or fail to improve.  ? ?Procedures: ?No procedures performed  ?Cervical Epidural Steroid Injection - Interlaminar Approach with Fluoroscopic Guidance ? ?Patient: Javier Avila      ?Date of Birth: 1955/05/01 ?MRN: [id number] ?PCP: Alicia Amel, MD      ?Visit Date: 08/14/2021 ?  ?Universal Protocol:    ?Date/Time: 04/26/234:51 AM ? ?Consent Given By: the patient ? ?Position: PRONE ? ?Additional Comments: ?Vital signs were monitored before and after the procedure. ?Patient was prepped and draped in the usual sterile fashion. ?The correct patient, procedure, and site was verified. ? ? ?Injection Procedure  Details:  ? ?Procedure diagnoses: Radiculopathy, cervical region [M54.12]   ? ?Meds Administered:  ?Meds ordered this encounter  ?Medications  ? methylPREDNISolone acetate (DEPO-MEDROL) injection 80 mg  ?  ? ?Laterality: Left ? ?Location/Site: C7-T1 ? ?Needle: 3.5 in., 20 ga. Tuohy ? ?Needle Placement: Paramedian epidural space ? ?Findings: ? -Comments: Excellent flow of contrast into the epidural space. ? ?Procedure Details: ?Using a paramedian approach from the side mentioned above, the region overlying the inferior lamina was localized under fluoroscopic visualization and the soft tissues overlying this structure were infiltrated with 4 ml. of 1% Lidocaine without Epinephrine. A # 20 gauge, Tuohy needle was inserted into the epidural space using a paramedian approach. ? ?The epidural space was localized using loss of resistance along with contralateral oblique bi-planar fluoroscopic views.  After negative aspirate for air, blood, and CSF, a 2 ml. volume of Isovue-250 was injected into the epidural space and the flow of contrast was observed. Radiographs were obtained for documentation purposes.  ? ?The injectate was administered into the level noted above. ? ?Additional Comments:  ?The patient tolerated the procedure well ?Dressing: 2 x 2 sterile gauze and Band-Aid ?  ? ?Post-procedure details: ?Patient was observed during the procedure. ?Post-procedure instructions were reviewed. ? ?Patient left the clinic in stable condition.   ? ?Clinical History: ?No specialty comments available.  ? ? ? ?Objective:  VS:  HT:    WT:   BMI:     BP:   HR: bpm  TEMP: ( )  RESP:  ?  Physical Exam ?Vitals and nursing note reviewed.  ?Constitutional:   ?   General: He is not in acute distress. ?   Appearance: Normal appearance. He is not ill-appearing.  ?HENT:  ?   Head: Normocephalic and atraumatic.  ?   Right Ear: External ear normal.  ?   Left Ear: External ear normal.  ?Eyes:  ?   Extraocular Movements: Extraocular movements  intact.  ?Cardiovascular:  ?   Rate and Rhythm: Normal rate.  ?   Pulses: Normal pulses.  ?Abdominal:  ?   General: There is no distension.  ?   Palpations: Abdomen is soft.  ?Musculoskeletal:     ?   General: No signs of injury.  ?   Cervical back: Neck supple. Tenderness present. No rigidity.  ?   Right lower leg: No edema.  ?   Left lower leg: No edema.  ?   Comments: Patient has good strength in the upper extremities with 5 out of 5 strength in wrist extension long finger flexion APB.  No intrinsic hand muscle atrophy.  Negative Hoffmann's test.  ?Lymphadenopathy:  ?   Cervical: No cervical adenopathy.  ?Skin: ?   Findings: No erythema or rash.  ?Neurological:  ?   General: No focal deficit present.  ?   Mental Status: He is alert and oriented to person, place, and time.  ?   Sensory: No sensory deficit.  ?   Motor: No weakness or abnormal muscle tone.  ?   Coordination: Coordination normal.  ?Psychiatric:     ?   Mood and Affect: Mood normal.     ?   Behavior: Behavior normal.  ?  ? ?Imaging: ?No results found. ?

## 2021-08-23 NOTE — Procedures (Signed)
Cervical Epidural Steroid Injection - Interlaminar Approach with Fluoroscopic Guidance ? ?Patient: Javier Avila      ?Date of Birth: 07/19/1955 ?MRN: [id number] ?PCP: Eppie Gibson, MD      ?Visit Date: 08/14/2021 ?  ?Universal Protocol:    ?Date/Time: 04/26/234:51 AM ? ?Consent Given By: the patient ? ?Position: PRONE ? ?Additional Comments: ?Vital signs were monitored before and after the procedure. ?Patient was prepped and draped in the usual sterile fashion. ?The correct patient, procedure, and site was verified. ? ? ?Injection Procedure Details:  ? ?Procedure diagnoses: Radiculopathy, cervical region [M54.12]   ? ?Meds Administered:  ?Meds ordered this encounter  ?Medications  ? methylPREDNISolone acetate (DEPO-MEDROL) injection 80 mg  ?  ? ?Laterality: Left ? ?Location/Site: C7-T1 ? ?Needle: 3.5 in., 20 ga. Tuohy ? ?Needle Placement: Paramedian epidural space ? ?Findings: ? -Comments: Excellent flow of contrast into the epidural space. ? ?Procedure Details: ?Using a paramedian approach from the side mentioned above, the region overlying the inferior lamina was localized under fluoroscopic visualization and the soft tissues overlying this structure were infiltrated with 4 ml. of 1% Lidocaine without Epinephrine. A # 20 gauge, Tuohy needle was inserted into the epidural space using a paramedian approach. ? ?The epidural space was localized using loss of resistance along with contralateral oblique bi-planar fluoroscopic views.  After negative aspirate for air, blood, and CSF, a 2 ml. volume of Isovue-250 was injected into the epidural space and the flow of contrast was observed. Radiographs were obtained for documentation purposes.  ? ?The injectate was administered into the level noted above. ? ?Additional Comments:  ?The patient tolerated the procedure well ?Dressing: 2 x 2 sterile gauze and Band-Aid ?  ? ?Post-procedure details: ?Patient was observed during the procedure. ?Post-procedure instructions were  reviewed. ? ?Patient left the clinic in stable condition.  ?

## 2021-09-04 ENCOUNTER — Telehealth: Payer: Self-pay | Admitting: Physical Medicine and Rehabilitation

## 2021-09-04 NOTE — Telephone Encounter (Signed)
Pt called with an FYI. Injection to neck worked wonders. No call back needed. Pt will call in future in neck pains come back. ?

## 2021-10-02 ENCOUNTER — Telehealth: Payer: Self-pay | Admitting: Physical Medicine and Rehabilitation

## 2021-10-02 NOTE — Telephone Encounter (Signed)
Pt called and states he is in so much pain right now he can barley stand. He wants newton to call him   Cb [phone]

## 2021-10-03 ENCOUNTER — Encounter: Payer: Self-pay | Admitting: Physical Medicine and Rehabilitation

## 2021-10-03 ENCOUNTER — Ambulatory Visit (INDEPENDENT_AMBULATORY_CARE_PROVIDER_SITE_OTHER): Payer: Medicaid Other | Admitting: Physical Medicine and Rehabilitation

## 2021-10-03 ENCOUNTER — Encounter: Payer: Self-pay | Admitting: *Deleted

## 2021-10-03 VITALS — BP 147/85 | HR 77

## 2021-10-03 DIAGNOSIS — M5442 Lumbago with sciatica, left side: Secondary | ICD-10-CM | POA: Diagnosis not present

## 2021-10-03 DIAGNOSIS — M48061 Spinal stenosis, lumbar region without neurogenic claudication: Secondary | ICD-10-CM | POA: Diagnosis not present

## 2021-10-03 DIAGNOSIS — M5441 Lumbago with sciatica, right side: Secondary | ICD-10-CM | POA: Diagnosis not present

## 2021-10-03 DIAGNOSIS — M7918 Myalgia, other site: Secondary | ICD-10-CM | POA: Diagnosis not present

## 2021-10-03 DIAGNOSIS — G8929 Other chronic pain: Secondary | ICD-10-CM

## 2021-10-03 DIAGNOSIS — M5416 Radiculopathy, lumbar region: Secondary | ICD-10-CM

## 2021-10-03 MED ORDER — DULOXETINE HCL 30 MG PO CPEP: ORAL_CAPSULE | ORAL | 3 refills | Status: AC

## 2021-10-03 NOTE — Progress Notes (Unsigned)
Pt state lower back pain. Pt state standing and walking make the pain worse. Pt state he takes over the counter pain meds to help ease his pain.  Numeric Pain Rating Scale and Functional Assessment Average Pain 10 Pain Right Now 8 My pain is constant, sharp, dull, and aching Pain is worse with: walking, standing, and some activites Pain improves with: rest, medication, and injections   In the last MONTH (on 0-10 scale) has pain interfered with the following?  1. General activity like being  able to carry out your everyday physical activities such as walking, climbing stairs, carrying groceries, or moving a chair?  Rating(5)  2. Relation with others like being able to carry out your usual social activities and roles such as  activities at home, at work and in your community. Rating(6)  3. Enjoyment of life such that you have  been bothered by emotional problems such as feeling anxious, depressed or irritable?  Rating(7)

## 2021-10-03 NOTE — Progress Notes (Unsigned)
Javier Avila - 66 y.o. male MRN [id number]  Date of birth: June 22, 1955  Office Visit Note: Visit Date: 10/03/2021 PCP: Javier Avila Referred by: Javier Avila  Subjective: Chief Complaint  Patient presents with   Lower Back - Pain   HPI: Javier Avila is a 66 y.o. male who comes in today for evaluation of chronic, worsening and severe bilateral lower back pain radiating down both legs. Patient also reports intermittent radiation of pain up lower back. Pain has been ongoing for several years and is exacerbated by sitting, movement and activity. He describes his pain as constant aching and sore sensation. Patient states his pain became worse after dancing at a party this past weekend, currently rates as pain as 7 out of 10. Patient reports some relief with home exercise regimen, rest and use of over the counter medications. Patient has attended formal physical therapy in the past at [facility] and reports no relief of pain with these treatments. Patient's recent lumbar MRI exhibits mild left foraminal and bilateral lateral recess narrowing at L3-L4 that could be affecting the descending L4 nerve roots. There is also mild right foraminal and bilateral lateral recess narrowing at L4-L5 that could be affecting the descending L5 nerve roots. There is moderate facet hypertrophy noted at L3-L4 and mild at L4-L5.  No high-grade spinal canal stenosis noted. Patient has had multiple lumbar epidural steroid injections performed in our office over the years with short term relief, only for several days. Patient states his pain continues to negatively impact his daily life. Patient denies focal weakness, numbness and tingling. Patient denies recent trauma or falls.   Review of Systems  Musculoskeletal:  Positive for back pain.  Neurological:  Negative for tingling, sensory change, focal weakness and weakness.  All other systems reviewed and are negative. Otherwise per  HPI.  Assessment & Plan: Visit Diagnoses:    ICD-10-CM   1. Lumbar radiculopathy  M54.16 Ambulatory referral to Orthopedic Surgery    2. Chronic bilateral low back pain with bilateral sciatica  M54.42 Ambulatory referral to Orthopedic Surgery   M54.41    G89.29     3. Foraminal stenosis of lumbar region  M48.061 Ambulatory referral to Orthopedic Surgery    4. Myofascial pain syndrome  M79.18 Ambulatory referral to Orthopedic Surgery       Plan: Findings:  Chronic, worsening and severe bilateral lower back pain radiating down legs. Patient continues to have severe pain despite good conservative therapies such as formal physical therapy, home exercise regimen, rest and use of over the counter medications. Patients clinical presentation and exam are consistent with both L4/L5 nerve pattern and myofascial pain, I do feel there could a central sensitization syndrome such as fibromyalgia that is working to exacerbate his symptoms. I do not feel repeating lumbar epidural steroid at this time would be beneficial as previous injections provided short term relief. I did speak with patient about medication management and wrote a prescription for Cymbalta. I also feel patient would benefit from speaking with surgeon to discuss possible options, referral placed to Dr. Rodell Perna in our practice. I would like to see patient back for follow up in approximately 4 weeks. Patient encouraged to remain active. No red flag symptoms noted upon exam today.    Meds & Orders:  Meds ordered this encounter  Medications   DULoxetine (CYMBALTA) 30 MG capsule    Sig: Take 1 capsule (30 mg total) once a day by mouth  for 2 weeks, then take 1 capsule (30 mg) twice a day.    Dispense:  60 capsule    Refill:  3    Order Specific Question:   Supervising Provider    Answer:   Magnus Sinning [id number]    Orders Placed This Encounter  Procedures   Ambulatory referral to Orthopedic Surgery    Follow-up: Return for 4 week  follow up for re-evaluation.   Procedures: No procedures performed      Clinical History: EXAM: MRI LUMBAR SPINE WITHOUT CONTRAST   TECHNIQUE: Multiplanar, multisequence MR imaging of the lumbar spine was performed. No intravenous contrast was administered.   COMPARISON:  None.   FINDINGS: Segmentation:  Standard.   Alignment:  Mild dextrocurvature.  Otherwise physiologic.   Vertebrae: No acute fracture or suspicious osseous lesion. Congenitally short pedicles, which narrow the AP diameter of the spinal canal.   Conus medullaris and cauda equina: Conus extends to the L1 level. Conus and cauda equina appear normal.   Paraspinal and other soft tissues: Negative.   Disc levels:   T12-L1: No significant disc bulge. No spinal canal stenosis or neural foraminal narrowing.   L1-L2: No significant disc bulge. No spinal canal stenosis or neural foraminal narrowing.   L2-L3: Mild disc bulge, with small right foraminal protrusion. Mild facet arthropathy. No spinal canal stenosis or neural foraminal narrowing.   L3-L4: Disc height loss and moderate disc bulge with central annular fissure. Moderate facet arthropathy. Narrowing of the lateral recesses. No spinal canal stenosis. Mild left neural foraminal narrowing.   L4-L5: Moderate disc bulge. Mild facet arthropathy. Narrowing of the lateral recesses. No spinal canal stenosis. Mild right neural foraminal narrowing.   L5-S1: Disc bulge, somewhat eccentric to the right. Mild facet arthropathy. No spinal canal stenosis or neural foraminal narrowing.   IMPRESSION: 1. L3-L4 mild left neural foraminal narrowing. Narrowing of the lateral recesses at this level could affect the descending L4 nerve roots. 2. L4-L5 mild right neural foraminal narrowing. Narrowing of the lateral recesses at this level could affect the descending L5 nerve roots. 3. No spinal canal stenosis.     Electronically Signed   By: Javier Avila M.D.    On: 05/23/2021 03:05   He reports that he has been smoking cigarettes. He started smoking about 47 years ago. He has a 21.50 pack-year smoking history. He has never used smokeless tobacco. No results for input(s): HGBA1C, LABURIC in the last 8760 hours.  Objective:  VS:  HT:    WT:   BMI:     BP:(!) 147/85  HR:77bpm  TEMP: ( )  RESP:  Physical Exam Vitals and nursing note reviewed.  HENT:     Head: Normocephalic and atraumatic.     Right Ear: External ear normal.     Left Ear: External ear normal.     Nose: Nose normal.     Mouth/Throat:     Mouth: Mucous membranes are moist.  Eyes:     Extraocular Movements: Extraocular movements intact.  Cardiovascular:     Rate and Rhythm: Normal rate.     Pulses: Normal pulses.  Pulmonary:     Effort: Pulmonary effort is normal.  Abdominal:     General: Abdomen is flat. There is no distension.  Musculoskeletal:        General: Tenderness present.     Cervical back: Normal range of motion.     Comments: Pt is slow to rise from seated position to standing. Good lumbar range  of motion. Strong distal strength without clonus, no pain upon palpation of greater trochanters. Sensation intact bilaterally. Dysesthesias noted to bilateral L4/L5 dermatomes. Walks independently, gait steady.   Skin:    General: Skin is warm and dry.     Capillary Refill: Capillary refill takes less than 2 seconds.  Neurological:     General: No focal deficit present.     Mental Status: He is alert and oriented to person, place, and time.  Psychiatric:        Mood and Affect: Mood normal.        Behavior: Behavior normal.    Ortho Exam  Imaging: No results found.  Past Medical/Family/Surgical/Social History: Medications & Allergies reviewed per EMR, new medications updated. Patient Active Problem List   Diagnosis Date Noted   Frequent falls 12/12/2020   Acute lumbar back pain 12/10/2018   Angina pectoris ([facility]) 12/10/2018   Cigarette smoker 08/23/2018    COPD GOLD 0/ still smoking  08/22/2018   Dyspnea on exertion 01/27/2018   Health care maintenance 12/05/2017   Hyperlipidemia 12/06/2015   History of hypertension 12/06/2015   Pre-diabetes 12/06/2015   Chronic lower back pain 12/06/2015   Past Medical History:  Diagnosis Date   Arthritis    Falls infrequently 02/28/2018   Heel pain, bilateral 12/06/2015   Hypertension    Lower back pain    Stroke [facility])    Family History  Family history unknown: Yes   History reviewed. No pertinent surgical history. Social History   Occupational History   Not on file  Tobacco Use   Smoking status: Every Day    Packs/day: 0.50    Years: 43.00    Pack years: 21.50    Types: Cigarettes    Start date: 04/30/1974   Smokeless tobacco: Never  Vaping Use   Vaping Use: Never used  Substance and Sexual Activity   Alcohol use: No   Drug use: Not Currently    Comment: About once monthly when in severe pain or needs to relax   Sexual activity: Not Currently

## 2021-10-25 ENCOUNTER — Other Ambulatory Visit: Payer: Self-pay | Admitting: Physical Medicine and Rehabilitation

## 2021-11-01 ENCOUNTER — Ambulatory Visit: Payer: Medicaid Other | Admitting: Orthopaedic Surgery

## 2021-11-01 DIAGNOSIS — M4802 Spinal stenosis, cervical region: Secondary | ICD-10-CM | POA: Diagnosis not present

## 2021-11-01 DIAGNOSIS — M48061 Spinal stenosis, lumbar region without neurogenic claudication: Secondary | ICD-10-CM | POA: Diagnosis not present

## 2021-11-01 NOTE — Progress Notes (Signed)
Office Visit Note   Patient: Javier Avila           Date of Birth: June 01, 1955           MRN: [id number] Visit Date: 11/01/2021              Requested by: Juanda Chance, NP [address] [place],  [place] [address] PCP: Alicia Amel, MD   Assessment & Plan: Visit Diagnoses:  1. Lumbar foraminal stenosis   2. Spinal stenosis of cervical region     Plan: Patient has some foraminal narrowing L3-4 L4-5.  We discussed if he has severe foraminal stenosis and radicular symptoms then instrumented fusion would be indicated and he would have to quit smoking to proceed with this.  At this point his mobility is normal he can walk as far as he wants not limited in his activity.  Would recommend continue Cymbalta.  Would not recommend surgery at this point and patient is relieved that surgery is not indicated.  We reviewed MRI cervical MRI lumbar discussed pathophysiology.  Discussed indications for surgery and appropriate treatment.  Follow-Up Instructions: No follow-ups on file.   Orders:  No orders of the defined types were placed in this encounter.  No orders of the defined types were placed in this encounter.     Procedures: No procedures performed   Clinical Data: No additional findings.   Subjective: Chief Complaint  Patient presents with   Lower Back - Follow-up, Pain    HPI 66 year old male here for surgical consultation considering chronic back pain.  He is epidurals in the past and got temporary relief.  Recently started Cymbalta and states his pain is doing a lot better and the Cymbalta is giving him better pain relief than anything that he has had.  Patient is also has some chronic neck pain but no numbness or weakness in the upper extremities.  No myelopathic symptoms.  MRI cervical 06/23/2021 showed disc degeneration with flattening of the cord at C5-6 and biforaminal impingement C5-6 C6-7.  He states one-point is low back pain was a 20 on a 1-10 scale.  He was  able to get out of bed ambulating in the community at the same time he states his pain is super severe.  He has had some diarrhea in the past no bladder problems.  He denies claudication symptoms.  Review of Systems patient's long-term smoker.  Positive COPD Gold positive for hypertension history of angina prediabetes.   Objective: Vital Signs: BP (!) 143/74   Pulse 61   Physical Exam Constitutional:      Appearance: He is well-developed.  HENT:     Head: Normocephalic and atraumatic.     Right Ear: External ear normal.     Left Ear: External ear normal.  Eyes:     Pupils: Pupils are equal, round, and reactive to light.  Neck:     Thyroid: No thyromegaly.     Trachea: No tracheal deviation.  Cardiovascular:     Rate and Rhythm: Normal rate.  Pulmonary:     Effort: Pulmonary effort is normal.     Breath sounds: No wheezing.  Abdominal:     General: Bowel sounds are normal.     Palpations: Abdomen is soft.  Musculoskeletal:     Cervical back: Neck supple.  Skin:    General: Skin is warm and dry.     Capillary Refill: Capillary refill takes less than 2 seconds.  Neurological:     Mental  Status: He is alert and oriented to person, place, and time.  Psychiatric:        Behavior: Behavior normal.        Thought Content: Thought content normal.        Judgment: Judgment normal.     Ortho Exam patient gets rapidly from sitting standing he can heel and toe walk.  Negative straight leg raising 90 degrees.  Knee and ankle jerk are intact anterior tib EHL is strong peroneals are strong no gastrocsoleus weakness no atrophy.  Hip rotation internal/external rotation is normal.  Specialty Comments:  EXAM: MRI LUMBAR SPINE WITHOUT CONTRAST   TECHNIQUE: Multiplanar, multisequence MR imaging of the lumbar spine was performed. No intravenous contrast was administered.   COMPARISON:  None.   FINDINGS: Segmentation:  Standard.   Alignment:  Mild dextrocurvature.  Otherwise  physiologic.   Vertebrae: No acute fracture or suspicious osseous lesion. Congenitally short pedicles, which narrow the AP diameter of the spinal canal.   Conus medullaris and cauda equina: Conus extends to the L1 level. Conus and cauda equina appear normal.   Paraspinal and other soft tissues: Negative.   Disc levels:   T12-L1: No significant disc bulge. No spinal canal stenosis or neural foraminal narrowing.   L1-L2: No significant disc bulge. No spinal canal stenosis or neural foraminal narrowing.   L2-L3: Mild disc bulge, with small right foraminal protrusion. Mild facet arthropathy. No spinal canal stenosis or neural foraminal narrowing.   L3-L4: Disc height loss and moderate disc bulge with central annular fissure. Moderate facet arthropathy. Narrowing of the lateral recesses. No spinal canal stenosis. Mild left neural foraminal narrowing.   L4-L5: Moderate disc bulge. Mild facet arthropathy. Narrowing of the lateral recesses. No spinal canal stenosis. Mild right neural foraminal narrowing.   L5-S1: Disc bulge, somewhat eccentric to the right. Mild facet arthropathy. No spinal canal stenosis or neural foraminal narrowing.   IMPRESSION: 1. L3-L4 mild left neural foraminal narrowing. Narrowing of the lateral recesses at this level could affect the descending L4 nerve roots. 2. L4-L5 mild right neural foraminal narrowing. Narrowing of the lateral recesses at this level could affect the descending L5 nerve roots. 3. No spinal canal stenosis.     Electronically Signed   By: Wiliam Ke M.D.   On: 05/23/2021 03:05  Imaging: No results found.   PMFS History: Patient Active Problem List   Diagnosis Date Noted   Lumbar foraminal stenosis 11/01/2021   Spinal stenosis of cervical region 11/01/2021   Frequent falls 12/12/2020   Acute lumbar back pain 12/10/2018   Angina pectoris ([facility]) 12/10/2018   Cigarette smoker 08/23/2018   COPD GOLD 0/ still smoking   08/22/2018   Dyspnea on exertion 01/27/2018   Health care maintenance 12/05/2017   Hyperlipidemia 12/06/2015   History of hypertension 12/06/2015   Pre-diabetes 12/06/2015   Chronic lower back pain 12/06/2015   Past Medical History:  Diagnosis Date   Arthritis    Falls infrequently 02/28/2018   Heel pain, bilateral 12/06/2015   Hypertension    Lower back pain    Stroke [facility])     Family History  Family history unknown: Yes    No past surgical history on file. Social History   Occupational History   Not on file  Tobacco Use   Smoking status: Every Day    Packs/day: 0.50    Years: 43.00    Total pack years: 21.50    Types: Cigarettes    Start date: 04/30/1974  Smokeless tobacco: Never  Vaping Use   Vaping Use: Never used  Substance and Sexual Activity   Alcohol use: No   Drug use: Not Currently    Comment: About once monthly when in severe pain or needs to relax   Sexual activity: Not Currently

## 2021-11-06 ENCOUNTER — Ambulatory Visit (INDEPENDENT_AMBULATORY_CARE_PROVIDER_SITE_OTHER): Payer: Medicaid Other | Admitting: Physical Medicine and Rehabilitation

## 2021-11-06 ENCOUNTER — Encounter: Payer: Self-pay | Admitting: Physical Medicine and Rehabilitation

## 2021-11-06 VITALS — BP 136/87 | HR 94

## 2021-11-06 DIAGNOSIS — M4802 Spinal stenosis, cervical region: Secondary | ICD-10-CM | POA: Diagnosis not present

## 2021-11-06 DIAGNOSIS — M5416 Radiculopathy, lumbar region: Secondary | ICD-10-CM | POA: Diagnosis not present

## 2021-11-06 DIAGNOSIS — M542 Cervicalgia: Secondary | ICD-10-CM

## 2021-11-06 DIAGNOSIS — M48061 Spinal stenosis, lumbar region without neurogenic claudication: Secondary | ICD-10-CM | POA: Diagnosis not present

## 2021-11-06 DIAGNOSIS — M5412 Radiculopathy, cervical region: Secondary | ICD-10-CM

## 2021-11-06 DIAGNOSIS — M4726 Other spondylosis with radiculopathy, lumbar region: Secondary | ICD-10-CM

## 2021-11-06 NOTE — Progress Notes (Unsigned)
Pt state he pain in his lower back has been feeling better. Pt state the rx his was giving helps a lot but having loose bowels with the meds.

## 2021-11-06 NOTE — Progress Notes (Unsigned)
Javier Avila - 66 y.o. male MRN [id number]  Date of birth: 10-24-1955  Office Visit Note: Visit Date: 11/06/2021 PCP: Javier Gibson, MD Referred by: Javier Gibson, MD  Subjective: Chief Complaint  Patient presents with   Lower Back - Pain   HPI: Javier Avila is a 66 y.o. male who comes in today for evaluation of chronic bilateral lower back pain radiating down both legs with intermittent referral of pain up his lower back. Pain has been ongoing for several years and is exacerbated by sitting, movement and activity. He describes his pain as an aching and sore sensation. Patient currently denies pain at this time. Patient reports some relief with home exercise regimen, rest and use of over the counter medications. Patient has attended formal physical therapy in the past at [facility] and reports no relief of pain with these treatments. Patient's lumbar MRI from January of this year exhibits mild left foraminal and bilateral lateral recess narrowing at L3-L4 that could be affecting the descending L4 nerve roots. There is also mild right foraminal and bilateral lateral recess narrowing at L4-L5 that could be affecting the descending L5 nerve roots. There is moderate facet hypertrophy noted at L3-L4 and mild at L4-L5.  No high-grade spinal canal stenosis noted. Patient has a history of multiple lumbar epidural steroid injections performed in our office over the years, provided only minimal relief pain for a couple of days. Patient was recently started on Cymbalta during out last office visit and reports significant relief of pain with this medication. Patient states he is now able to function without severe pain and is more active. Patient recently had surgical consultation with Dr. Rodell Avila, per Dr. Lorin Avila notes he would consider lumbar fusion, however patient would need to quit smoking to become a candidate. Patient states he is not interested in pursuing surgery at  this time and would like to continue with Cymbalta.   Patient also reports chronic bilateral neck pain radiating to bilateral shoulders and down left arm to fingers. Pain has been ongoing for several years and is exacerbated by movement and activity. He currently denies pain. Patients recent cervical MRI exhibits degeneration affecting the disc spaces of C4-C5 to C6-C7 where there spinal canal narrowing with cord indentation, cord flattening greatest at C5-C6. Patient underwent left C7-T1 interlaminar epidural steroid injection in our office on 08/14/2021 and reports some relief of pain for several weeks, however he feels Cymbalta has helped significant more than injection.   Patients course is complicated by tobacco abuse and COPD. Patient denies focal weakness, numbness and tingling. Patient denies recent trauma or falls.    Review of Systems  Musculoskeletal:  Positive for back pain, myalgias and neck pain.  Neurological:  Negative for tingling, sensory change, focal weakness and weakness.  All other systems reviewed and are negative.  Otherwise per HPI.  Assessment & Plan: Visit Diagnoses:    ICD-10-CM   1. Lumbar radiculopathy  M54.16     2. 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 Other spondylosis with radiculopathy, lumbar region  M47.26     4. Radiculopathy, cervical region  M54.12     5. Cervicalgia  M54.2     6. Spinal stenosis of cervical region  M48.02        Plan: Findings:  1. Chronic bilateral lower back pain radiating down both legs with intermittent referral of pain up his lower back. Patients clinical presentation and exam  are consistent with both L4/L5 nerve pattern and myofascial pain, I do feel there could a central sensitization syndrome such as fibromyalgia that is working to exacerbate his symptoms. I do not feel repeating lumbar epidural steroid at this time would be beneficial as previous injections provided short term relief. I would like to  continue Cymbalta as this medication is providing significant relief of pain.   2. Chronic bilateral neck pain radiating to bilateral shoulders and down left arm to fingers, some relief of pain for several days with left C7-T1 interlaminar epidural steroid injection in April, however significant  relief of pain with recent addition of Cymbalta. I do not feel repeating cervical epidural steroid injection at this time would be beneficial. If his pain returns and injections are short lived I do think he could benefit from speaking with neurosurgeon at some point.   Patient instructed to follow up with [place] as needed. No red flag symptoms noted upon exam today.     Meds & Orders: No orders of the defined types were placed in this encounter.  No orders of the defined types were placed in this encounter.   Follow-up: No follow-ups on file.   Procedures: No procedures performed      Clinical History: EXAM: MRI LUMBAR SPINE WITHOUT CONTRAST   TECHNIQUE: Multiplanar, multisequence MR imaging of the lumbar spine was performed. No intravenous contrast was administered.   COMPARISON:  None.   FINDINGS: Segmentation:  Standard.   Alignment:  Mild dextrocurvature.  Otherwise physiologic.   Vertebrae: No acute fracture or suspicious osseous lesion. Congenitally short pedicles, which narrow the AP diameter of the spinal canal.   Conus medullaris and cauda equina: Conus extends to the L1 level. Conus and cauda equina appear normal.   Paraspinal and other soft tissues: Negative.   Disc levels:   T12-L1: No significant disc bulge. No spinal canal stenosis or neural foraminal narrowing.   L1-L2: No significant disc bulge. No spinal canal stenosis or neural foraminal narrowing.   L2-L3: Mild disc bulge, with small right foraminal protrusion. Mild facet arthropathy. No spinal canal stenosis or neural foraminal narrowing.   L3-L4: Disc height loss and moderate disc bulge with central  annular fissure. Moderate facet arthropathy. Narrowing of the lateral recesses. No spinal canal stenosis. Mild left neural foraminal narrowing.   L4-L5: Moderate disc bulge. Mild facet arthropathy. Narrowing of the lateral recesses. No spinal canal stenosis. Mild right neural foraminal narrowing.   L5-S1: Disc bulge, somewhat eccentric to the right. Mild facet arthropathy. No spinal canal stenosis or neural foraminal narrowing.   IMPRESSION: 1. L3-L4 mild left neural foraminal narrowing. Narrowing of the lateral recesses at this level could affect the descending L4 nerve roots. 2. L4-L5 mild right neural foraminal narrowing. Narrowing of the lateral recesses at this level could affect the descending L5 nerve roots. 3. No spinal canal stenosis.     Electronically Signed   By: Wiliam Ke M.D.   On: 05/23/2021 03:05   He reports that he has been smoking cigarettes. He started smoking about 47 years ago. He has a 21.50 pack-year smoking history. He has never used smokeless tobacco. No results for input(s): "HGBA1C", "LABURIC" in the last 8760 hours.  Objective:  VS:  HT:    WT:   BMI:     BP:136/87  HR:94bpm  TEMP: ( )  RESP:  Physical Exam Vitals and nursing note reviewed.  HENT:     Head: Normocephalic and atraumatic.  Right Ear: External ear normal.     Left Ear: External ear normal.     Nose: Nose normal.     Mouth/Throat:     Mouth: Mucous membranes are moist.  Eyes:     Extraocular Movements: Extraocular movements intact.  Cardiovascular:     Rate and Rhythm: Normal rate.     Pulses: Normal pulses.  Pulmonary:     Effort: Pulmonary effort is normal.  Abdominal:     General: Abdomen is flat. There is no distension.  Musculoskeletal:        General: No tenderness.     Cervical back: No tenderness.     Comments: Pt rises from seated position to standing without difficulty. Good lumbar range of motion. Strong distal strength without clonus, no pain upon  palpation of greater trochanters. Sensation intact bilaterally. Walks independently, gait steady. Positive slump test.   No discomfort noted with flexion, extension and side-to-side rotation. Patient has good strength in the upper extremities including 5 out of 5 strength in wrist extension, long finger flexion and APB.  There is no atrophy of the hands intrinsically.  Sensation intact bilaterally. Negative Hoffman's sign.   Skin:    General: Skin is warm and dry.     Capillary Refill: Capillary refill takes less than 2 seconds.  Neurological:     General: No focal deficit present.     Mental Status: He is alert and oriented to person, place, and time.  Psychiatric:        Mood and Affect: Mood normal.        Behavior: Behavior normal.     Ortho Exam  Imaging: No results found.  Past Medical/Family/Surgical/Social History: Medications & Allergies reviewed per EMR, new medications updated. Patient Active Problem List   Diagnosis Date Noted   Lumbar foraminal stenosis 11/01/2021   Spinal stenosis of cervical region 11/01/2021   Frequent falls 12/12/2020   Acute lumbar back pain 12/10/2018   Angina pectoris (HCC) 12/10/2018   Cigarette smoker 08/23/2018   COPD GOLD 0/ still smoking  08/22/2018   Dyspnea on exertion 01/27/2018   Health care maintenance 12/05/2017   Hyperlipidemia 12/06/2015   History of hypertension 12/06/2015   Pre-diabetes 12/06/2015   Chronic lower back pain 12/06/2015   Past Medical History:  Diagnosis Date   Arthritis    Falls infrequently 02/28/2018   Heel pain, bilateral 12/06/2015   Hypertension    Lower back pain    Stroke [facility])    Family History  Family history unknown: Yes   History reviewed. No pertinent surgical history. Social History   Occupational History   Not on file  Tobacco Use   Smoking status: Every Day    Packs/day: 0.50    Years: 43.00    Total pack years: 21.50    Types: Cigarettes    Start date: 04/30/1974   Smokeless  tobacco: Never  Vaping Use   Vaping Use: Never used  Substance and Sexual Activity   Alcohol use: No   Drug use: Not Currently    Comment: About once monthly when in severe pain or needs to relax   Sexual activity: Not Currently

## 2022-08-24 ENCOUNTER — Other Ambulatory Visit: Payer: Self-pay

## 2022-08-24 ENCOUNTER — Emergency Department (HOSPITAL_COMMUNITY)
Admission: EM | Admit: 2022-08-24 | Discharge: 2022-08-24 | Disposition: A | Discharge status: Home / Self Care | Payer: Medicaid Other | Attending: Emergency Medicine | Admitting: Emergency Medicine

## 2022-08-24 DIAGNOSIS — M542 Cervicalgia: Secondary | ICD-10-CM | POA: Diagnosis not present

## 2022-08-24 DIAGNOSIS — I10 Essential (primary) hypertension: Secondary | ICD-10-CM | POA: Diagnosis not present

## 2022-08-24 DIAGNOSIS — M545 Low back pain, unspecified: Secondary | ICD-10-CM | POA: Diagnosis not present

## 2022-08-24 DIAGNOSIS — Z79899 Other long term (current) drug therapy: Secondary | ICD-10-CM | POA: Diagnosis not present

## 2022-08-24 DIAGNOSIS — M2550 Pain in unspecified joint: Secondary | ICD-10-CM | POA: Insufficient documentation

## 2022-08-24 DIAGNOSIS — M546 Pain in thoracic spine: Secondary | ICD-10-CM | POA: Diagnosis not present

## 2022-08-24 DIAGNOSIS — M79669 Pain in unspecified lower leg: Secondary | ICD-10-CM | POA: Diagnosis not present

## 2022-08-24 MED ORDER — PREDNISONE 20 MG PO TABS
60.0000 mg | ORAL_TABLET | Freq: Once | ORAL | Status: AC
  Administered 2022-08-24: 60 mg via ORAL
  Filled 2022-08-24: qty 3

## 2022-08-24 MED ORDER — KETOROLAC TROMETHAMINE 15 MG/ML IJ SOLN
15.0000 mg | Freq: Once | INTRAMUSCULAR | Status: AC
  Administered 2022-08-24: 15 mg via INTRAMUSCULAR
  Filled 2022-08-24: qty 1

## 2022-08-24 MED ORDER — METHOCARBAMOL 500 MG PO TABS: 1000.0000 mg | ORAL_TABLET | Freq: Four times a day (QID) | ORAL | 0 refills | Status: AC

## 2022-08-24 MED ORDER — PREDNISONE 20 MG PO TABS
ORAL_TABLET | ORAL | 0 refills | Status: DC
Start: 2022-08-24 — End: 2022-09-11

## 2022-08-24 NOTE — ED Provider Notes (Signed)
[place] EMERGENCY DEPARTMENT AT [facility] Provider Note   CSN: [id number] Arrival date & time: 08/24/22  1402     History  No chief complaint on file.   [name] is a 67 y.o. male.  Patient with history of cervical and lumbar radiculopathy, history of angina, hypertension, hyperlipidemia --presents to the emergency department today for evaluation of neck pain, lower back pain, and leg pain.  Patient reports that these are chronic in nature.  He presented today because he states "I do not like being stiff".  He denies chest pain or shortness of breath.  He denies abdominal pain or vomiting.  No fevers or cough.  Patient was previously on several different medications including muscle relaxer and Cymbalta.  He states that he does not currently take any of these.  Last seen July 2023 by orthopedist group.  He was doing well on Cymbalta at that time.  They do not feel that repeat injections in the back would be successful.  Denies focal joint swelling or redness.  Patient denies warning symptoms of back pain including: fecal incontinence, urinary retention or overflow incontinence, night sweats, waking from sleep with back pain, unexplained fevers or weight loss, h/o cancer, IVDU, recent trauma.  Patient reports some intermittent dizziness but not currently stating that sometimes his eyes feel like they go out of focus.  No vision loss. Patient denies signs of stroke including: facial droop, slurred speech, aphasia, weakness/numbness in extremities, imbalance/trouble walking.  He does state that sometimes his right knee will give out and he will fall, but no recent falls.       Home Medications Prior to Admission medications   Medication Sig Start Date End Date Taking? Authorizing Provider  albuterol (PROVENTIL HFA;VENTOLIN HFA) 108 (90 Base) MCG/ACT inhaler Inhale 2 puffs into the lungs every 6 (six) hours as needed for wheezing or shortness of breath. 01/27/18   Allayne Stack, DO  atorvastatin (LIPITOR) 40 MG tablet Take 1 tablet (40 mg total) by mouth daily. 12/12/20   [name] Manners, MD  baclofen (LIORESAL) 10 MG tablet Take 0.5-1 tablets (5-10 mg total) by mouth 3 (three) times daily as needed for muscle spasms. 12/12/20   [name] Manners, MD  diclofenac sodium (VOLTAREN) 1 % GEL Apply 4 g topically 4 (four) times daily. Patient taking differently: Apply 4 g topically daily. 11/29/17   Myrene Buddy, MD  DULoxetine (CYMBALTA) 30 MG capsule Take 1 capsule (30 mg total) once a day by mouth for 2 weeks, then take 1 capsule (30 mg) twice a day. 10/03/21   Juanda Chance, NP  isosorbide mononitrate (IMDUR) 30 MG 24 hr tablet TAKE 1 TABLET BY MOUTH EVERY DAY 12/22/19   Allayne Stack, DO  meloxicam (MOBIC) 15 MG tablet Take 0.5-1 tablets (7.5-15 mg total) by mouth daily as needed for pain. 12/14/19   Hilts, Casimiro Needle, MD  methocarbamol (ROBAXIN) 500 MG tablet Take 1 tablet (500 mg total) by mouth 2 (two) times daily as needed for muscle spasms. 02/17/21   Eber Hong, MD  mometasone-formoterol [facility]) 200-5 MCG/ACT AERO Inhale 2 puffs into the lungs 2 (two) times daily. 12/12/20   [name] Manners, MD  naproxen (NAPROSYN) 500 MG tablet Take 1 tablet (500 mg total) by mouth 2 (two) times daily with a meal. 02/17/21   Eber Hong, MD  nitroGLYCERIN (NITROSTAT) 0.4 MG SL tablet Place 1 tablet (0.4 mg total) under the tongue every 5 (five) minutes as needed for  chest pain. 12/12/20   [name] Manners, MD      Allergies    Patient has no known allergies.    Review of Systems   Review of Systems  Physical Exam Updated Vital Signs BP (!) 126/97   Pulse (!) 54   Temp (!) 97.5 F (36.4 C) (Oral)   Resp 12   Ht 6\' 4"  (1.93 m)   Wt 97.5 kg   SpO2 99%   BMI 26.17 kg/m   Physical Exam Vitals and nursing note reviewed.  Constitutional:      Appearance: He is well-developed.  HENT:     Head: Normocephalic and atraumatic.  Eyes:     Conjunctiva/sclera:  Conjunctivae normal.  Abdominal:     Palpations: Abdomen is soft.     Tenderness: There is no abdominal tenderness. There is no right CVA tenderness or left CVA tenderness.  Musculoskeletal:        General: No tenderness. Normal range of motion.     Cervical back: Normal range of motion.     Comments: No step-off noted with palpation of spine.  Normal active range of motion of the bilateral arms and bilateral legs without focal joint pain.  Mild tenderness over the cervical spine bilaterally, paraspinous.  Mild tenderness over the lumbar spine and the paraspinous musculature bilaterally.  Skin:    General: Skin is warm and dry.  Neurological:     Mental Status: He is alert.     Sensory: No sensory deficit.     Motor: No abnormal muscle tone.     Comments: 5/5 strength in entire lower extremities bilaterally. No sensation deficit.   Psychiatric:        Mood and Affect: Mood normal.     ED Results / Procedures / Treatments   Labs (all labs ordered are listed, but only abnormal results are displayed) Labs Reviewed - No data to display  EKG None  Radiology No results found.  Procedures Procedures    Medications Ordered in ED Medications  ketorolac (TORADOL) 15 MG/ML injection 15 mg (15 mg Intramuscular Given 08/24/22 1444)  predniSONE (DELTASONE) tablet 60 mg (60 mg Oral Given 08/24/22 1444)    ED Course/ Medical Decision Making/ A&P    3:26 PM Patient seen and examined. Work-up initiated. Medications ordered.  No focal problems noted on exam and this seems to be an acute on chronic flare of chronic joint and musculoskeletal pain.  No strokelike symptoms.  Vital signs reviewed and are as follows: Vitals:   08/24/22 1413 08/24/22 1430  BP: 116/78 (!) 126/97  Pulse: (!) 54 (!) 54  Resp: 14 12  Temp: (!) 97.5 F (36.4 C)   SpO2: 97% 99%    No red flag s/s of low back pain. Patient was counseled on back pain precautions and told to do activity as tolerated but do not  lift, push, or pull heavy objects more than 10 pounds for the next week.  Patient counseled to use ice or heat on back for no longer than 15 minutes every hour.   Patient counseled on proper use of muscle relaxant medication.  They were told not to drink alcohol, drive any vehicle, or do any dangerous activities while taking this medication.  Patient verbalized understanding.  Patient urged to follow-up with PCP if pain does not improve with treatment and rest or if pain becomes recurrent. Urged to return with worsening severe pain, loss of bowel or bladder control, trouble walking.   The patient  verbalizes understanding and agrees with the plan.                             Medical Decision Making Risk Prescription drug management.   Patient with neck and back pain. No neurological deficits. Patient is ambulatory. No warning symptoms of back pain including: fecal incontinence, urinary retention or overflow incontinence, night sweats, waking from sleep with back pain, unexplained fevers or weight loss, h/o cancer, IVDU, recent trauma. No concern for cauda equina, epidural abscess, or other serious cause of back pain. Conservative measures such as rest, ice/heat and pain medicine indicated with PCP follow-up if no improvement with conservative management.   Patient without chest pain or shortness of breath to suggest anginal equivalent and have low concern for pneumonia, pneumothorax, dissection at this time.         Final Clinical Impression(s) / ED Diagnoses Final diagnoses:  Generalized joint pain    Rx / DC Orders ED Discharge Orders          Ordered    predniSONE (DELTASONE) 20 MG tablet        08/24/22 1448    methocarbamol (ROBAXIN) 500 MG tablet  4 times daily        08/24/22 1448              Renne Crigler, PA-C 08/24/22 1542    Rondel Baton, MD 08/25/22 1023

## 2022-08-24 NOTE — ED Triage Notes (Signed)
Pt via [facility] from home c/o multiple chronic issues including "pinched nerve" in his back, neck pain, bilateral knee pain, gait instability. Pt has had all of these concerns for 3+ months and does not have PCP for ongoing care. A/O x 4  BP 154/90 HR 54 O2 96% RA CBG 88

## 2022-08-24 NOTE — Discharge Instructions (Signed)
Please read and follow all provided instructions.  Your diagnoses today include:  1. Generalized joint pain    Tests performed today include: Vital signs. See below for your results today.   Medications prescribed:  Prednisone - steroid medicine   It is best to take this medication in the morning to prevent sleeping problems. If you are diabetic, monitor your blood sugar closely and stop taking Prednisone if blood sugar is over 300. Take with food to prevent stomach upset.   Robaxin (methocarbamol) - muscle relaxer medication  DO NOT drive or perform any activities that require you to be awake and alert because this medicine can make you drowsy.   Take any prescribed medications only as directed.  Home care instructions:  Follow any educational materials contained in this packet.  BE VERY CAREFUL not to take multiple medicines containing Tylenol (also called acetaminophen). Doing so can lead to an overdose which can damage your liver and cause liver failure and possibly death.   Follow-up instructions: Please follow-up with your primary care provider in the next 3 days for further evaluation of your symptoms.   Return instructions:  Please return to the Emergency Department if you experience worsening symptoms.  Please return if you have any other emergent concerns.  Additional Information:  Your vital signs today were: BP (!) 126/97   Pulse (!) 54   Temp (!) 97.5 F (36.4 C) (Oral)   Resp 12   Ht 6\' 4"  (1.93 m)   Wt 97.5 kg   SpO2 99%   BMI 26.17 kg/m  If your blood pressure (BP) was elevated above 135/85 this visit, please have this repeated by your doctor within one month. --------------

## 2022-09-11 ENCOUNTER — Encounter: Payer: Self-pay | Admitting: Family Medicine

## 2022-09-11 ENCOUNTER — Ambulatory Visit (INDEPENDENT_AMBULATORY_CARE_PROVIDER_SITE_OTHER): Payer: Medicaid Other | Admitting: Family Medicine

## 2022-09-11 VITALS — BP 110/78 | HR 66 | Ht 76.0 in | Wt 205.0 lb

## 2022-09-11 DIAGNOSIS — F17200 Nicotine dependence, unspecified, uncomplicated: Secondary | ICD-10-CM

## 2022-09-11 DIAGNOSIS — Z8679 Personal history of other diseases of the circulatory system: Secondary | ICD-10-CM | POA: Diagnosis not present

## 2022-09-11 DIAGNOSIS — Z23 Encounter for immunization: Secondary | ICD-10-CM | POA: Diagnosis not present

## 2022-09-11 DIAGNOSIS — F17210 Nicotine dependence, cigarettes, uncomplicated: Secondary | ICD-10-CM | POA: Diagnosis not present

## 2022-09-11 DIAGNOSIS — M545 Low back pain, unspecified: Secondary | ICD-10-CM

## 2022-09-11 DIAGNOSIS — M48061 Spinal stenosis, lumbar region without neurogenic claudication: Secondary | ICD-10-CM | POA: Diagnosis not present

## 2022-09-11 DIAGNOSIS — J449 Chronic obstructive pulmonary disease, unspecified: Secondary | ICD-10-CM | POA: Diagnosis not present

## 2022-09-11 DIAGNOSIS — R7303 Prediabetes: Secondary | ICD-10-CM

## 2022-09-11 DIAGNOSIS — Z0000 Encounter for general adult medical examination without abnormal findings: Secondary | ICD-10-CM

## 2022-09-11 DIAGNOSIS — Z1211 Encounter for screening for malignant neoplasm of colon: Secondary | ICD-10-CM | POA: Diagnosis not present

## 2022-09-11 DIAGNOSIS — I209 Angina pectoris, unspecified: Secondary | ICD-10-CM | POA: Diagnosis not present

## 2022-09-11 DIAGNOSIS — E785 Hyperlipidemia, unspecified: Secondary | ICD-10-CM | POA: Diagnosis not present

## 2022-09-11 MED ORDER — ZOSTER VAC RECOMB ADJUVANTED 50 MCG/0.5ML IM SUSR
0.5000 mL | Freq: Once | INTRAMUSCULAR | 0 refills | Status: AC
Start: 2022-09-11 — End: 2022-09-11

## 2022-09-11 MED ORDER — PREDNISONE 20 MG PO TABS
20.0000 mg | ORAL_TABLET | Freq: Every day | ORAL | 0 refills | Status: AC
Start: 2022-09-11 — End: 2022-09-16

## 2022-09-11 NOTE — Patient Instructions (Signed)
It was wonderful to see you today. Thank you for allowing me to be a part of your care. Below is a short summary of what we discussed at your visit today:  Joint pains Take prednisone 1 tablet every day for 5 days. You can keep the methocarbamol and use as needed.  Health Maintenance We like to think about ways to keep you healthy for years to come. Below are some interventions and screenings we can offer to keep you healthy: -COVID booster (available at any pharmacy) -Tdap booster, recommended once every 10 years (available here in clinic or at any pharmacy)  Lung cancer screening Is recommended to have lung cancer screening with a low-dose CT scan for anybody age 91 to 40 years with a 20-pack-year smoking history, who either currently smokes or quit within last 15 years. I have ordered this, the hospital will call you directly to schedule.   AAA screening An abdominal aortic aneurysm is a disorder of your large vessel coming out from the heart.  It is recommended that any man who is ever smoked get a one-time ultrasound to measure the size of the aorta between ages of 26 and 23. I have ordered this ultrasound for you.  We will schedule it after your birthday, so you will be 67 at that time.  Colon cancer screening I have ordered your Cologuard.  These are to be done every couple of years. This should be mailed to your house.  Please complete as instructed and mail back.  Shingles Vaccine I have written a prescription for the shingles vaccine.  Please take this to your pharmacy to have this administered. Your insurance prefers you get this specific vaccine at the pharmacy instead of in clinic.    Please bring all of your medications to every appointment! If you have any questions or concerns, please do not hesitate to contact us via phone or MyChart message.   Fayette Pho, MD

## 2022-09-11 NOTE — Progress Notes (Unsigned)
    SUBJECTIVE:   CHIEF COMPLAINT / HPI:   Pain    Left ribs hurt x 2-3 weeks Improves but nerves still here  Unsetadiness, vision troubles Ibuprofen OTC sometimes - pain pill as needed   Given robaxin and prednisone in ED - relief with meds  Been moving, bending  Reports little gray pill - baclofen? - put him in the bathroom  PERTINENT  PMH / PSH:  Patient Active Problem List   Diagnosis Date Noted   Lumbar foraminal stenosis 11/01/2021   Spinal stenosis of cervical region 11/01/2021   Frequent falls 12/12/2020   Acute lumbar back pain 12/10/2018   Angina pectoris (HCC) 12/10/2018   Cigarette smoker 08/23/2018   COPD GOLD 0/ still smoking  08/22/2018   Dyspnea on exertion 01/27/2018   Health care maintenance 12/05/2017   Hyperlipidemia 12/06/2015   History of hypertension 12/06/2015   Pre-diabetes 12/06/2015   Chronic lower back pain 12/06/2015    OBJECTIVE:   BP 110/78   Pulse 66   Ht 6\' 4"  (1.93 m)   Wt 205 lb (93 kg)   SpO2 97%   BMI 24.95 kg/m   ***  ASSESSMENT/PLAN:   No problem-specific Assessment & Plan notes found for this encounter.   AAA screen Cologuard  Shingles vacc  Fayette Pho, MD [facility]

## 2022-09-12 LAB — COMPREHENSIVE METABOLIC PANEL
ALT: 28 IU/L (ref 0–44)
AST: 22 IU/L (ref 0–40)
Albumin/Globulin Ratio: 2 (ref 1.2–2.2)
Albumin: 4.1 g/dL (ref 3.9–4.9)
Alkaline Phosphatase: 75 IU/L (ref 44–121)
BUN/Creatinine Ratio: 19 (ref 10–24)
BUN: 18 mg/dL (ref 8–27)
Bilirubin Total: 0.4 mg/dL (ref 0.0–1.2)
CO2: 22 mmol/L (ref 20–29)
Calcium: 9.3 mg/dL (ref 8.6–10.2)
Chloride: 105 mmol/L (ref 96–106)
Creatinine, Ser: 0.96 mg/dL (ref 0.76–1.27)
Globulin, Total: 2.1 g/dL (ref 1.5–4.5)
Glucose: 100 mg/dL — ABNORMAL HIGH (ref 70–99)
Potassium: 4.3 mmol/L (ref 3.5–5.2)
Sodium: 141 mmol/L (ref 134–144)
Total Protein: 6.2 g/dL (ref 6.0–8.5)
eGFR: 88 mL/min/{1.73_m2} (ref >59)

## 2022-09-12 LAB — HEMOGLOBIN A1C
Est. average glucose Bld gHb Est-mCnc: 128 mg/dL
Hgb A1c MFr Bld: 6.1 % — ABNORMAL HIGH (ref 4.8–5.6)

## 2022-09-12 LAB — CBC
Hematocrit: 50.4 % (ref 37.5–51.0)
Hemoglobin: 17.2 g/dL (ref 13.0–17.7)
MCH: 31.9 pg (ref 26.6–33.0)
MCHC: 34.1 g/dL (ref 31.5–35.7)
MCV: 93 fL (ref 79–97)
Platelets: 235 10*3/uL (ref 150–450)
RBC: 5.4 x10E6/uL (ref 4.14–5.80)
RDW: 13 % (ref 11.6–15.4)
WBC: 13.5 10*3/uL — ABNORMAL HIGH (ref 3.4–10.8)

## 2022-09-12 LAB — LIPID PANEL
Chol/HDL Ratio: 3.4 ratio (ref 0.0–5.0)
Cholesterol, Total: 200 mg/dL — ABNORMAL HIGH (ref 100–199)
HDL: 58 mg/dL (ref >39)
LDL Chol Calc (NIH): 120 mg/dL — ABNORMAL HIGH (ref 0–99)
Triglycerides: 122 mg/dL (ref 0–149)
VLDL Cholesterol Cal: 22 mg/dL (ref 5–40)

## 2022-09-12 MED ORDER — ATORVASTATIN CALCIUM 40 MG PO TABS: 40.0000 mg | ORAL_TABLET | Freq: Every day | ORAL | 3 refills | Status: DC

## 2022-09-12 NOTE — Addendum Note (Signed)
Addended by: Valetta Close on: 09/12/2022 05:37 PM   Modules accepted: Orders

## 2022-09-12 NOTE — Assessment & Plan Note (Signed)
A1c check today

## 2022-09-12 NOTE — Assessment & Plan Note (Signed)
Responding well to Robaxin and prednisone, however prednisone is being taken as needed.  Patient previously evaluated by orthopedics and declined surgery.  At this time, will have patient continue Robaxin as needed.  Rx prednisone 5-day course, discussed with patient need for consecutive dosing to achieve intended effect.

## 2022-09-12 NOTE — Assessment & Plan Note (Signed)
Well-controlled today without any medication.  Patient discloses he is not taking his Imdur tablets.  Given HTN is well-controlled without medication he is asymptomatic from a chest pain standpoint at this time, will not restart.

## 2022-09-12 NOTE — Assessment & Plan Note (Signed)
Patient not taking his atorvastatin, previously prescribed 40 mg daily.  Will obtain lipid panel today.

## 2022-09-12 NOTE — Assessment & Plan Note (Addendum)
Patient reports he has not been taking his previously prescribed Imdur.  Still has nitroglycerin SL tablet, however has not needed in quite some time.  Given patient has normal BP and is asymptomatic, will not restart at this time.

## 2022-09-12 NOTE — Assessment & Plan Note (Signed)
Patient is he reports he is not taking any inhalers, he is prescribed Dulera twice daily and albuterol as needed.  Discussed that I am happy to send in new prescriptions, however he politely declines.

## 2022-09-12 NOTE — Assessment & Plan Note (Signed)
-   AAA screening ultrasound ordered today, can be completed anytime - CT lung cancer screening to be completed November 29, 2022 or later - Patient provided prescription for shingles vaccine - Cologuard ordered for colon cancer screening - Will obtain CBC, CMP, A1c, and lipid panel today to update risk stratification

## 2022-09-12 NOTE — Assessment & Plan Note (Addendum)
Patient currently smoking, has COPD.  Discussed need for CT lung cancer screening starting age 65.  He turns 67 this summer.  Will order low-dose CT lung screening, to be scheduled August 1 or later.  Also discussed need for once in a lifetime abdominal ultrasound for AAA.  Patient amenable.  AAA ultrasound ordered today.

## 2022-09-13 ENCOUNTER — Telehealth: Payer: Self-pay

## 2022-09-13 NOTE — Telephone Encounter (Signed)
Called pt. Left message letting pt know the following appts have been made for him.  CT Lung Cancer screening  Monday Aug 5th, 10:00am [address] [place] Must call 24 hours ahead to cancel to avoid $75 no show charge.  [phone]  Vascular US - (This office spoke to pt to schedule) [facility]  No food after 11PM the night before.  Water is OK. (Don't drink liquids if you have been instructed not to for ANOTHER test). Take two Extra-Strength Gas-X capsules at bedtime the night before test.   Take an additional two Extra-Strength Gas-X capsules three (3) hours before the test or first thing in the morning.   Avoid foods that produce bowel gas, for 24 hours prior to exam (see below).   No breakfast, no chewing gum, no smoking or carbonated beverages. Patient may take morning medications with water. Come in for test at least 15 minutes early to register  Sunday Spillers, CMA

## 2022-09-21 LAB — COLOGUARD

## 2022-12-03 ENCOUNTER — Ambulatory Visit
Admission: RE | Admit: 2022-12-03 | Discharge: 2022-12-03 | Disposition: A | Discharge status: Home / Self Care | Payer: Medicare Other | Source: Ambulatory Visit | Attending: Family Medicine | Admitting: Family Medicine

## 2022-12-03 DIAGNOSIS — F17210 Nicotine dependence, cigarettes, uncomplicated: Secondary | ICD-10-CM

## 2022-12-03 DIAGNOSIS — F17200 Nicotine dependence, unspecified, uncomplicated: Secondary | ICD-10-CM

## 2022-12-04 ENCOUNTER — Ambulatory Visit (HOSPITAL_COMMUNITY)
Admission: RE | Admit: 2022-12-04 | Discharge: 2022-12-04 | Disposition: A | Discharge status: Home / Self Care | Payer: Medicare Other | Source: Ambulatory Visit | Attending: Internal Medicine | Admitting: Internal Medicine

## 2022-12-04 DIAGNOSIS — F17200 Nicotine dependence, unspecified, uncomplicated: Secondary | ICD-10-CM | POA: Insufficient documentation

## 2022-12-04 DIAGNOSIS — F17210 Nicotine dependence, cigarettes, uncomplicated: Secondary | ICD-10-CM | POA: Insufficient documentation

## 2022-12-04 DIAGNOSIS — Z136 Encounter for screening for cardiovascular disorders: Secondary | ICD-10-CM | POA: Diagnosis present

## 2022-12-09 NOTE — Progress Notes (Unsigned)
Office Visit    Patient Name: Javier Avila Date of Encounter: 12/09/2022  Primary Care Provider:  Alicia Amel, MD Primary Cardiologist:  None Primary Electrophysiologist: None   Past Medical History    Past Medical History:  Diagnosis Date   Arthritis    Falls infrequently 02/28/2018   Heel pain, bilateral 12/06/2015   Hypertension    Lower back pain    Stroke (HCC)    No past surgical history on file.  Allergies  No Known Allergies   History of Present Illness    Javier Avila  is a 67 year old male with a PMH of CAD s/p inferior MI, COPD, back pain, HTN, HLD, prediabetes who presents for follow-up.  Javier Avila was seen initially by Dr. Katrinka Blazing and 12/2019 to establish care.  He has a history significant for prior MI by echo but not confirmed by coronary angiography.  During visit patient was smoking 1-1/2 packs/day chest discomfort and tightness with emotional stress.  He was scheduled for a myocardial perfusion test to evaluate his coronary risk but this was not completed.  Since last being seen in the office patient reports***.  Patient denies chest pain, palpitations, dyspnea, PND, orthopnea, nausea, vomiting, dizziness, syncope, edema, weight gain, or early satiety.     ***Notes:  Home Medications    Current Outpatient Medications  Medication Sig Dispense Refill   albuterol (PROVENTIL HFA;VENTOLIN HFA) 108 (90 Base) MCG/ACT inhaler Inhale 2 puffs into the lungs every 6 (six) hours as needed for wheezing or shortness of breath. (Patient not taking: Reported on 09/11/2022) 1 Inhaler 2   atorvastatin (LIPITOR) 40 MG tablet Take 1 tablet (40 mg total) by mouth daily. 90 tablet 3   baclofen (LIORESAL) 10 MG tablet Take 0.5-1 tablets (5-10 mg total) by mouth 3 (three) times daily as needed for muscle spasms. (Patient not taking: Reported on 09/11/2022) 100 each 3   diclofenac sodium (VOLTAREN) 1 % GEL Apply 4 g topically 4 (four) times daily. (Patient not taking:  Reported on 09/11/2022) 100 g 0   DULoxetine (CYMBALTA) 30 MG capsule Take 1 capsule (30 mg total) once a day by mouth for 2 weeks, then take 1 capsule (30 mg) twice a day. (Patient not taking: Reported on 09/11/2022) 60 capsule 3   isosorbide mononitrate (IMDUR) 30 MG 24 hr tablet TAKE 1 TABLET BY MOUTH EVERY DAY (Patient not taking: Reported on 09/11/2022) 30 tablet 2   methocarbamol (ROBAXIN) 500 MG tablet Take 2 tablets (1,000 mg total) by mouth 4 (four) times daily. (Patient not taking: Reported on 09/11/2022) 30 tablet 0   mometasone-formoterol (DULERA) 200-5 MCG/ACT AERO Inhale 2 puffs into the lungs 2 (two) times daily. (Patient not taking: Reported on 09/11/2022) 8.8 g 5   naproxen (NAPROSYN) 500 MG tablet Take 1 tablet (500 mg total) by mouth 2 (two) times daily with a meal. 30 tablet 0   nitroGLYCERIN (NITROSTAT) 0.4 MG SL tablet Place 1 tablet (0.4 mg total) under the tongue every 5 (five) minutes as needed for chest pain. (Patient not taking: Reported on 09/11/2022) 50 tablet 0   No current facility-administered medications for this visit.     Review of Systems  Please see the history of present illness.    (+)*** (+)***  All other systems reviewed and are otherwise negative except as noted above.  Physical Exam    Wt Readings from Last 3 Encounters:  09/11/22 205 lb (93 kg)  08/24/22 215 lb (97.5 kg)  06/29/21 211 lb (95.7 kg)   ZO:XWRUE were no vitals filed for this visit.,There is no height or weight on file to calculate BMI.  Constitutional:      Appearance: Healthy appearance. Not in distress.  Neck:     Vascular: JVD normal.  Pulmonary:     Effort: Pulmonary effort is normal.     Breath sounds: No wheezing. No rales. Diminished in the bases Cardiovascular:     Normal rate. Regular rhythm. Normal S1. Normal S2.      Murmurs: There is no murmur.  Edema:    Peripheral edema absent.  Abdominal:     Palpations: Abdomen is soft non tender. There is no hepatomegaly.   Skin:    General: Skin is warm and dry.  Neurological:     General: No focal deficit present.     Mental Status: Alert and oriented to person, place and time.     Cranial Nerves: Cranial nerves are intact.  EKG/LABS/ Recent Cardiac Studies    ECG personally reviewed by me today - ***   Risk Assessment/Calculations:   {Does this patient have ATRIAL FIBRILLATION?[phone]}        Lab Results  Component Value Date   WBC 13.5 (H) 09/11/2022   HGB 17.2 09/11/2022   HCT 50.4 09/11/2022   MCV 93 09/11/2022   PLT 235 09/11/2022   Lab Results  Component Value Date   CREATININE 0.96 09/11/2022   BUN 18 09/11/2022   NA 141 09/11/2022   K 4.3 09/11/2022   CL 105 09/11/2022   CO2 22 09/11/2022   Lab Results  Component Value Date   ALT 28 09/11/2022   AST 22 09/11/2022   ALKPHOS 75 09/11/2022   BILITOT 0.4 09/11/2022   Lab Results  Component Value Date   CHOL 200 (H) 09/11/2022   HDL 58 09/11/2022   LDLCALC 120 (H) 09/11/2022   TRIG 122 09/11/2022   CHOLHDL 3.4 09/11/2022    Lab Results  Component Value Date   HGBA1C 6.1 (H) 09/11/2022     Assessment & Plan    1.  History of angina: -Patient seen initially by Dr. Katrinka Blazing in 2021 with previous history of possible MI but not verified by ischemic workup. -Today patient reports***  2.  Hyperlipidemia: -Patient's last LDL cholesterol was***  3.  COPD: -Patient is currently smoking***  4.  HTN: -Patient's blood pressure today was***      Disposition: Follow-up with None or APP in *** months {Are you ordering a CV Procedure (e.g. stress test, cath, DCCV, TEE, etc)?   Press F2        :[id number]}   Medication Adjustments/Labs and Tests Ordered: Current medicines are reviewed at length with the patient today.  Concerns regarding medicines are outlined above.   Signed, Napoleon Form, Leodis Rains, NP 12/09/2022, 9:13 AM [facility]

## 2022-12-10 ENCOUNTER — Ambulatory Visit: Payer: Medicare Other | Attending: Nurse Practitioner | Admitting: Nurse Practitioner

## 2022-12-10 ENCOUNTER — Encounter: Payer: Self-pay | Admitting: Nurse Practitioner

## 2022-12-10 VITALS — BP 124/70 | HR 61 | Ht 76.0 in | Wt 205.0 lb

## 2022-12-10 DIAGNOSIS — I209 Angina pectoris, unspecified: Secondary | ICD-10-CM | POA: Diagnosis present

## 2022-12-10 DIAGNOSIS — Z8679 Personal history of other diseases of the circulatory system: Secondary | ICD-10-CM | POA: Diagnosis present

## 2022-12-10 DIAGNOSIS — E785 Hyperlipidemia, unspecified: Secondary | ICD-10-CM | POA: Diagnosis present

## 2022-12-10 DIAGNOSIS — R0609 Other forms of dyspnea: Secondary | ICD-10-CM | POA: Diagnosis present

## 2022-12-10 DIAGNOSIS — J449 Chronic obstructive pulmonary disease, unspecified: Secondary | ICD-10-CM | POA: Diagnosis present

## 2022-12-10 MED ORDER — ATORVASTATIN CALCIUM 80 MG PO TABS: 80.0000 mg | ORAL_TABLET | Freq: Every day | ORAL | 6 refills | Status: AC

## 2022-12-10 NOTE — Patient Instructions (Signed)
Medication Instructions:  INCREASE Lipitor to 80mg  Take 1 tablet once a day  *If you need a refill on your cardiac medications before your next appointment, please call your pharmacy*   Lab Work: 8 WEEKS LFTs & LIPIDS If you have labs (blood work) drawn today and your tests are completely normal, you will receive your results only by: MyChart Message (if you have MyChart) OR A paper copy in the mail If you have any lab test that is abnormal or we need to change your treatment, we will call you to review the results.   Testing/Procedures: Your physician has requested that you have an echocardiogram. Echocardiography is a painless test that uses sound waves to create images of your heart. It provides your doctor with information about the size and shape of your heart and how well your heart's chambers and valves are working. This procedure takes approximately one hour. There are no restrictions for this procedure. Please do NOT wear cologne, perfume, aftershave, or lotions (deodorant is allowed). Please arrive 15 minutes prior to your appointment time.  Your physician has requested that you have an exercise tolerance test. For further information please visit [email or web address] Please also follow instruction sheet, as given.   Follow-Up: At [facility], you and your health needs are our priority.  As part of our continuing mission to provide you with exceptional heart care, we have created designated Provider Care Teams.  These Care Teams include your primary Cardiologist (physician) and Advanced Practice Providers (APPs -  Physician Assistants and Nurse Practitioners) who all work together to provide you with the care you need, when you need it.  We recommend signing up for the patient portal called "MyChart".  Sign up information is provided on this After Visit Summary.  MyChart is used to connect with patients for Virtual Visits (Telemedicine).  Patients are able to view lab/test  results, encounter notes, upcoming appointments, etc.  Non-urgent messages can be sent to your provider as well.   To learn more about what you can do with MyChart, go to [email or web address].    Your next appointment:   12 month(s) IF NEEDED  Provider:   Jari Favre, PA-C, Ronie Spies, PA-C, Robin Searing, NP, Jacolyn Reedy, PA-C, Eligha Bridegroom, NP, Tereso Newcomer, PA-C, or Perlie Gold, PA-C       Other Instructions

## 2023-01-03 ENCOUNTER — Ambulatory Visit (INDEPENDENT_AMBULATORY_CARE_PROVIDER_SITE_OTHER): Payer: 59

## 2023-01-03 ENCOUNTER — Ambulatory Visit: Payer: 59 | Attending: Nurse Practitioner

## 2023-01-03 DIAGNOSIS — J449 Chronic obstructive pulmonary disease, unspecified: Secondary | ICD-10-CM | POA: Insufficient documentation

## 2023-01-03 DIAGNOSIS — I209 Angina pectoris, unspecified: Secondary | ICD-10-CM

## 2023-01-03 DIAGNOSIS — E785 Hyperlipidemia, unspecified: Secondary | ICD-10-CM | POA: Insufficient documentation

## 2023-01-03 DIAGNOSIS — Z8679 Personal history of other diseases of the circulatory system: Secondary | ICD-10-CM

## 2023-01-03 DIAGNOSIS — R0609 Other forms of dyspnea: Secondary | ICD-10-CM

## 2023-01-03 LAB — EXERCISE TOLERANCE TEST
Angina Index: 0
Duke Treadmill Score: 9
Estimated workload: 10.1
Exercise duration (min): 9 min
Exercise duration (sec): 0 s
MPHR: 155 {beats}/min
Peak HR: 141 {beats}/min
Percent HR: 90 %
RPE: 15
Rest HR: 55 {beats}/min
ST Depression (mm): 0 mm

## 2023-01-03 LAB — ECHOCARDIOGRAM COMPLETE
Area-P 1/2: 3.85 cm2
S' Lateral: 3.4 cm

## 2023-01-04 ENCOUNTER — Telehealth: Payer: Self-pay | Admitting: Nurse Practitioner

## 2023-01-04 NOTE — Telephone Encounter (Signed)
Patient was calling to inform us he did receive the results from the Echo. Patient stated he did not need a call back.

## 2023-02-04 ENCOUNTER — Ambulatory Visit: Payer: 59 | Attending: Nurse Practitioner

## 2023-02-04 DIAGNOSIS — E785 Hyperlipidemia, unspecified: Secondary | ICD-10-CM

## 2023-02-04 DIAGNOSIS — J449 Chronic obstructive pulmonary disease, unspecified: Secondary | ICD-10-CM

## 2023-02-04 DIAGNOSIS — I209 Angina pectoris, unspecified: Secondary | ICD-10-CM

## 2023-02-04 DIAGNOSIS — Z8679 Personal history of other diseases of the circulatory system: Secondary | ICD-10-CM

## 2023-02-05 LAB — LIPID PANEL
Chol/HDL Ratio: 3.1 {ratio} (ref 0.0–5.0)
Cholesterol, Total: 111 mg/dL (ref 100–199)
HDL: 36 mg/dL — ABNORMAL LOW (ref >39)
LDL Chol Calc (NIH): 60 mg/dL (ref 0–99)
Triglycerides: 75 mg/dL (ref 0–149)
VLDL Cholesterol Cal: 15 mg/dL (ref 5–40)

## 2023-02-05 LAB — HEPATIC FUNCTION PANEL
ALT: 23 [IU]/L (ref 0–44)
AST: 27 [IU]/L (ref 0–40)
Albumin: 4.2 g/dL (ref 3.9–4.9)
Alkaline Phosphatase: 98 [IU]/L (ref 44–121)
Bilirubin Total: 0.3 mg/dL (ref 0.0–1.2)
Bilirubin, Direct: 0.11 mg/dL (ref 0.00–0.40)
Total Protein: 6.3 g/dL (ref 6.0–8.5)

## 2023-10-08 ENCOUNTER — Encounter: Payer: Self-pay | Admitting: *Deleted

## 2023-12-24 NOTE — Progress Notes (Signed)
    SUBJECTIVE:   CHIEF COMPLAINT / HPI:   Shoulder pain R shoulder. X 1 month but has been intermittent over time has been using a sling as needed for discomfort. No inciting event.. Known R shoulder OA. Using ice and icy hot. Taking Baclofen  and methocarbamol  without relief. Not taking Naproxen .  Declines interested in physical therapy.  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  Will obtain imaging given patient stated known significant arthritis but no imaging available for review.  Supportive care with NSAIDs and follow-up in 4-6 weeks if not improved.  Received successful subacromial injection as below. -Right shoulder x-ray -Continue ice and naproxen  twice daily x 2-week course   Right subacromial steroid injection After informed written consent timeout was performed, patient was seated in chair in exam room. Right shoulder was prepped with alcohol swab and utilizing lateral approach with ultrasound guidance, patient's right subacromial space was injected with 4:1 lidocaine: depomedrol. Patient tolerated the procedure well without immediate complications.   *Advised patient to schedule follow-up with PCP given health maintenance gaps   Dr. Izetta Nap, DO [facility]

## 2023-12-25 ENCOUNTER — Encounter: Payer: Self-pay | Admitting: Family Medicine

## 2023-12-25 ENCOUNTER — Ambulatory Visit: Admitting: Family Medicine

## 2023-12-25 VITALS — BP 132/80 | HR 61

## 2023-12-25 DIAGNOSIS — G8929 Other chronic pain: Secondary | ICD-10-CM

## 2023-12-25 DIAGNOSIS — M25511 Pain in right shoulder: Secondary | ICD-10-CM | POA: Diagnosis not present

## 2023-12-25 MED ORDER — NAPROXEN 500 MG PO TABS
500.0000 mg | ORAL_TABLET | Freq: Two times a day (BID) | ORAL | 0 refills | Status: DC
Start: 2023-12-25 — End: 2024-01-24

## 2023-12-25 MED ORDER — METHYLPREDNISOLONE ACETATE 40 MG/ML IJ SUSP
40.0000 mg | Freq: Once | INTRAMUSCULAR | Status: AC
Start: 2023-12-25 — End: 2023-12-25
  Administered 2023-12-25: 40 mg via INTRAMUSCULAR

## 2023-12-25 NOTE — Patient Instructions (Signed)
 It was wonderful to see you today! Thank you for choosing [facility].   Please bring ALL of your medications with you to every visit.   Today we talked about:  You received a steroid injection into your right shoulder today.  As we discussed the lidocaine will help it feel better today and then will wear off and the steroid will kick in around 3 or 4 days.  This should help with your pain as I do think it is due to a nerve impingement issue.  I also ordered the x-ray of your right shoulder to be done.  Please go to [address] which is [place] [facility] to have this done.  I also refilled the naproxen  that you can take twice per day for the next 2 weeks for this pain episode. I do recommend you come back into the clinic for your health maintenance as you are due for multiple tests.  Please follow up in 4 to 6 weeks if symptomatically not improved  Call the clinic at [phone] if your symptoms worsen or you have any concerns.  Please be sure to schedule follow up at the front desk before you leave today.   Izetta Nap, DO Family Medicine    Joint Injection After Care Medicines Take over-the-counter and prescription medicines only as told by your doctor. Ask your doctor if the medicine prescribed to you requires you to avoid driving or using machinery. Do not take medicines such as aspirin and ibuprofen  unless your doctor tells you to. Injection site care Follow instructions from your doctor about: How to take care of your injection site. When and how you should change your bandage. When you should take off your bandage. Check your injection area every day for signs of infection. Check for: More redness, swelling, or pain after 2 days. Fluid or blood. Warmth. Pus or a bad smell. Managing pain, stiffness, and swelling Use ice or an ice pack as told. Place a towel between your skin and the ice. Leave the ice on for 20 minutes, 2-3 times a day. If your  skin turns red, take off the ice right away to prevent skin damage. The risk of damage is higher if you can't feel pain, heat, or cold. Do not put heat to your joint. Raise your joint above the level of your heart while you're sitting or lying down. General instructions If you have a bandage, keep it dry until your doctor says it can be taken off. Ask your doctor when you can start taking showers and baths. Avoid activities that take a lot of effort for as long as told by your doctor. Ask what things are safe for you to do at home. Ask when you can go back to work or school. Keep all follow-up visits. You may need more injections. Contact a health care provider if: You have a fever. You have any signs of infection. Your symptoms at the injection site last longer than 2 days after your procedure. Get help right away if: Your joint turns very red. Your joint becomes very swollen. You have very bad joint pain.

## 2023-12-31 ENCOUNTER — Ambulatory Visit
Admission: RE | Admit: 2023-12-31 | Discharge: 2023-12-31 | Disposition: A | Discharge status: Home / Self Care | Source: Ambulatory Visit | Attending: Family Medicine

## 2023-12-31 DIAGNOSIS — G8929 Other chronic pain: Secondary | ICD-10-CM

## 2024-01-08 ENCOUNTER — Ambulatory Visit: Payer: Self-pay | Admitting: Family Medicine

## 2024-01-08 ENCOUNTER — Telehealth: Payer: Self-pay

## 2024-01-08 NOTE — Telephone Encounter (Signed)
 Patient calls nurse line requesting imaging results.   Advised results are not back yet.   Apologized for delay and advised of 2-3 week turn around time.   Advised we will call him with results as soon as we receive report.

## 2024-01-12 ENCOUNTER — Other Ambulatory Visit: Payer: Self-pay | Admitting: Family Medicine

## 2024-01-12 DIAGNOSIS — G8929 Other chronic pain: Secondary | ICD-10-CM

## 2024-01-13 NOTE — Telephone Encounter (Signed)
 Naproxen  prescribed as a two week treatment course, since completed.

## 2024-01-20 ENCOUNTER — Telehealth: Payer: Self-pay

## 2024-01-20 NOTE — Telephone Encounter (Signed)
 Patient calls back in regards to shoulder xray.   Advised on negative imaging and to FU if symptoms persist.   He reports he is still having significant pain in his right shoulder. He reports every time he moves his shoulder he can feel the pain.   He reports he has been taking Naproxen , however he no more of this. He reports he has been using a gel however minimal relief.   Patient scheduled for 9/26 for FU.

## 2024-01-20 NOTE — Telephone Encounter (Signed)
 Patient LVM on nurse line requesting imaging results.   I called patient back to discuss negative imaging, however no answer.   Advised to call back to discuss.

## 2024-01-24 ENCOUNTER — Ambulatory Visit (INDEPENDENT_AMBULATORY_CARE_PROVIDER_SITE_OTHER): Admitting: Family Medicine

## 2024-01-24 VITALS — BP 118/74 | HR 69 | Ht 76.0 in | Wt 205.0 lb

## 2024-01-24 DIAGNOSIS — M25511 Pain in right shoulder: Secondary | ICD-10-CM

## 2024-01-24 DIAGNOSIS — G8929 Other chronic pain: Secondary | ICD-10-CM | POA: Diagnosis not present

## 2024-01-24 MED ORDER — NAPROXEN 500 MG PO TABS: 500.0000 mg | ORAL_TABLET | Freq: Two times a day (BID) | ORAL | 0 refills | Status: AC

## 2024-01-24 NOTE — Patient Instructions (Signed)
 It was wonderful to see you today! Thank you for choosing [facility].   Please bring ALL of your medications with you to every visit.   Today we talked about:  I refilled the naproxen  that you can take twice per day as needed for the shoulder pain in addition to Tylenol .  Please continue to move your shoulder at home and do some exercises as this can prevent further adhesions and limited mobility of your shoulder. The faster and likely less expensive option would be to send you to sports medicine to have an ultrasound done first and if they think you need an MRI then we can pursue that route.  Usually they can get you in within the next week to be seen.  I placed the referral but you can go ahead and call them today to schedule an appointment at [phone] to schedule an appointment.  Please follow up for health maintenance with PCP and as needed for persistent symptoms  If you haven't already, sign up for My Chart to have easy access to your labs results, and communication with your primary care physician.  Call the clinic at [phone] if your symptoms worsen or you have any concerns.  Please be sure to schedule follow up at the front desk before you leave today.   Izetta Nap, DO Family Medicine

## 2024-01-24 NOTE — Progress Notes (Signed)
    SUBJECTIVE:   CHIEF COMPLAINT / HPI:   Right shoulder pain follow-up Seen 12/25/2023 for primary issue and received subacromial steroid injection.  Shoulder x-ray was negative for acute fracture or dislocation no evidence of arthritis.  Took naproxen  x 2 weeks, now out of the medication.  Injection helped some but still had pain. Could not get topical lidocaine patches to stick. Taking Tylenol  at needed.  Feels like his mobility and pain has worsened.  PERTINENT  PMH / PSH: COPD, current smoker, HLD, prediabetes  OBJECTIVE:   BP 118/74   Pulse 69   Ht 6' 4 (1.93 m)   Wt 205 lb (93 kg)   SpO2 97%   BMI 24.95 kg/m    General: NAD, pleasant, able to participate in exam MSK: Right arm -No erythema or deformity -TTP over anterior and lateral glenohumeral joints -Pain with shoulder flexion and abduction, able to abduct to shoulder to ~140 degrees -Significant pain with internal rotation -Positive Hawkins -Positive empty can test with some weakness noted on the right side  ASSESSMENT/PLAN:   Assessment & Plan Chronic right shoulder pain Progressive pain and now some weakness upon exam despite subacromial steroid injection, NSAIDs and home exercises at last visit.  Discussed PT referral, patient declined.  Will refer to sports medicine for consideration of ultrasound and/or need for MRI. -Refill naproxen  500 mg twice daily -Referral to sports medicine     *Needs follow up with PCP for health maintenance  Dr. Izetta Nap, DO [facility]

## 2024-01-28 ENCOUNTER — Other Ambulatory Visit: Payer: Self-pay

## 2024-01-28 ENCOUNTER — Encounter: Payer: Self-pay | Admitting: Family Medicine

## 2024-01-28 ENCOUNTER — Ambulatory Visit: Admitting: Family Medicine

## 2024-01-28 VITALS — BP 146/76 | Ht 76.0 in | Wt 205.0 lb

## 2024-01-28 DIAGNOSIS — S46811A Strain of other muscles, fascia and tendons at shoulder and upper arm level, right arm, initial encounter: Secondary | ICD-10-CM | POA: Diagnosis not present

## 2024-01-28 DIAGNOSIS — M75101 Unspecified rotator cuff tear or rupture of right shoulder, not specified as traumatic: Secondary | ICD-10-CM

## 2024-01-28 DIAGNOSIS — M25511 Pain in right shoulder: Secondary | ICD-10-CM

## 2024-01-28 MED ORDER — DICLOFENAC SODIUM 1 % EX GEL: 2.0000 g | Freq: Four times a day (QID) | CUTANEOUS | Status: AC

## 2024-01-28 NOTE — Progress Notes (Signed)
 DATE OF VISIT: 01/28/2024        Javier Avila DOB: 04-04-1956 MRN: [id number]  CC:  Rt shoulder pain  History- Javier Avila is a 68 y.o. LT-hand dominant male for evaluation and treatment of Rt shoulder pain Pain x 6-7 months No injury/trauma Started when trying to sleep Has pain along lateral shoulder Worse with lifting arm Trouble putting on/taking off jacket Pain with washing hair in shower (+)night pain Seen at [facility] multiple times - 12/25/23, 01/24/24 - notes reviewed during visit today - no improvement with subacromial CSI at [facility] 12/25/23 - no improvement with Naprosyn  bid prn No previous issues with shoulder Sometimes has some radiation into the arm/hand Occ numbness/tingling   Past Medical History Past Medical History:  Diagnosis Date   Arthritis    Falls infrequently 02/28/2018   Heel pain, bilateral 12/06/2015   Hypertension    Lower back pain    Stroke [facility])     Past Surgical History History reviewed. No pertinent surgical history.  Medications Current Outpatient Medications  Medication Sig Dispense Refill   albuterol  (PROVENTIL  HFA;VENTOLIN  HFA) 108 (90 Base) MCG/ACT inhaler Inhale 2 puffs into the lungs every 6 (six) hours as needed for wheezing or shortness of breath. 1 Inhaler 2   atorvastatin  (LIPITOR) 80 MG tablet Take 1 tablet (80 mg total) by mouth daily. 30 tablet 6   baclofen  (LIORESAL ) 10 MG tablet Take 0.5-1 tablets (5-10 mg total) by mouth 3 (three) times daily as needed for muscle spasms. 100 each 3   DULoxetine  (CYMBALTA ) 30 MG capsule Take 1 capsule (30 mg total) once a day by mouth for 2 weeks, then take 1 capsule (30 mg) twice a day. 60 capsule 3   methocarbamol  (ROBAXIN ) 500 MG tablet Take 2 tablets (1,000 mg total) by mouth 4 (four) times daily. 30 tablet 0   naproxen  (NAPROSYN ) 500 MG tablet Take 1 tablet (500 mg total) by mouth 2 (two) times daily with a meal. 60 tablet 0   nitroGLYCERIN  (NITROSTAT ) 0.4 MG SL tablet Place 1 tablet (0.4 mg total)  under the tongue every 5 (five) minutes as needed for chest pain. 50 tablet 0   Current Facility-Administered Medications  Medication Dose Route Frequency Provider Last Rate Last Admin   diclofenac  Sodium (VOLTAREN ) 1 % topical gel 2 g  2 g Topical QID Braylin Formby C, DO        Allergies has no known allergies.  Family History - reviewed per EMR and intake form  Social History   reports no history of alcohol use.  reports that he has been smoking cigarettes. He started smoking about 49 years ago. He has a 24.9 pack-year smoking history. He has never used smokeless tobacco.  reports that he does not currently use drugs. OCCUPATION: not working currently   EXAM: Vitals: BP (!) 146/76   Ht 6' 4 (1.93 m)   Wt 205 lb (93 kg)   BMI 24.95 kg/m  General: AOx3, NAD, pleasant SKIN: no rashes or lesions, skin clean, dry, intact MSK: CSPINE: FROM without pain, no midline or paraspinal tenderness, neg Spurling's b/l SHOULDER: RT SHOULDER without gross deformity.  Near FROM with (+)painful arch.  Mild tenderness palpation over the bicipital groove and greater tuberosity.  No tenderness over the [facility] joint.  Positive empty can, positive Hawkins, positive Neer, rotator cuff strength 5 -/5 throughout.  NEURO: sensation intact to light touch, DTR +2/4 bicep, tricep, brachioradialis bilaterally VASC: pulses 2+ and symmetric radial artery bilaterally, no edema  IMAGING: RT SHOULDER XR 12/31/23 personally reviewed and interpreted by me today showing: - no acute abnormality, no arthritis  COMPLETE MSK ultrasound right shoulder Date: 01/28/2024 Indication: Right shoulder pain Findings: - Bicep: Normal-appearing biceps in the bicipital groove.  Small amount of increased anechoic fluid.  No signs of tearing.  Well-visualized in short and long axis views - Subscapularis: Hypoechoic change at the insertional footplate, small calcification noted near the insertional footplate.  Partial articular sided  tearing noted.  No full-thickness tearing.  Seen in short and long axis views - AC joint with mild degenerative changes, no effusion - Small amount of increased fluid in the subacromial bursa with mild dynamic subacromial impingement - Supraspinatus with full-thickness intrasubstance tearing well-visualized in short and long axis views - Normal-appearing infraspinatus and teres minor - Normal-appearing posterior glenohumeral joint  Impression: - Partial tearing of the subscapularis and supraspinatus - Mild AC joint osteoarthritis - Mild subacromial bursitis with associated subacromial impingement Images and interpretation completed by Rainell Cedar, DO     Assessment & Plan Nontraumatic tear of supraspinatus tendon, right Acute right shoulder pain x 6 to 7 months with MSK ultrasound showing partial tear of the supraspinatus and subscapularis tendon - No improvement with prior subacromial injection 12/25/2023  Plan: - X-rays reviewed as noted above - Ultrasound findings reviewed with patient diagnosis and treatment discussed - Discussed proceeding with physical therapy.  Patient prefers home exercise program.  This was provided today. - Patient is not candidate for topical nitroglycerin  because he has history of chronic/recurrent headaches. - Recommend topical Voltaren  gel.  He requested prescription be sent to his pharmacy due to over-the-counter cost.  If not covered by insurance can purchase over-the-counter - Follow-up 6 weeks for reevaluation, sooner as needed.  If no improvement could consider trial of repeat injection or possible additional advanced imaging versus formal physical therapy Partial tear of right subscapularis tendon, initial encounter Acute right shoulder pain x 6 to 7 months with MSK ultrasound showing partial tear of the supraspinatus and subscapularis tendon - No improvement with prior subacromial injection 12/25/2023  Plan: - X-rays reviewed as noted above -  Ultrasound findings reviewed with patient diagnosis and treatment discussed - Discussed proceeding with physical therapy.  Patient prefers home exercise program.  This was provided today. - Patient is not candidate for topical nitroglycerin  because he has history of chronic/recurrent headaches. - Recommend topical Voltaren  gel.  He requested prescription be sent to his pharmacy due to over-the-counter cost.  If not covered by insurance can purchase over-the-counter - Follow-up 6 weeks for reevaluation, sooner as needed.  If no improvement could consider trial of repeat injection or possible additional advanced imaging versus formal physical therapy  Patient expressed understanding & agreement with above.  Encounter Diagnoses  Name Primary?   Nontraumatic tear of supraspinatus tendon, right Yes   Partial tear of right subscapularis tendon, initial encounter     Orders Placed This Encounter  Procedures   US  COMPLETE JOINT SPACE STRUCTURES UP RIGHT    Orders Placed This Encounter  Procedures   US  COMPLETE JOINT SPACE STRUCTURES UP RIGHT

## 2024-03-10 ENCOUNTER — Encounter: Payer: Self-pay | Admitting: Family Medicine

## 2024-03-10 ENCOUNTER — Ambulatory Visit (INDEPENDENT_AMBULATORY_CARE_PROVIDER_SITE_OTHER): Admitting: Family Medicine

## 2024-03-10 VITALS — BP 139/74 | Ht 76.0 in | Wt 205.0 lb

## 2024-03-10 DIAGNOSIS — S46811D Strain of other muscles, fascia and tendons at shoulder and upper arm level, right arm, subsequent encounter: Secondary | ICD-10-CM

## 2024-03-10 DIAGNOSIS — M75101 Unspecified rotator cuff tear or rupture of right shoulder, not specified as traumatic: Secondary | ICD-10-CM | POA: Diagnosis not present

## 2024-03-10 NOTE — Progress Notes (Signed)
 DATE OF VISIT: 03/10/2024        Javier Avila DOB: [birth year] MRN: [id number]  Discussed the use of AI scribe software for clinical note transcription with the patient, who gave verbal consent to proceed.  History of Present Illness Javier Avila is a 68 year old male who presents with persistent RT shoulder pain. Last seen by me 01/28/24  Shoulder pain - Persistent pain since last visit, with some improvement but ongoing discomfort - Pain primarily localized to the lateral shoulder at this time - Pain worsens when lying on the affected side - Tightness and discomfort occur in specific positions  Prior treatments and response - Regular home exercise regimen including band exercises and arm motions without bands -- doing these daily - Topical treatments including: Voltaren  gel, Aspercreme and Aleve  roll-on provide some relief - Prior subacromial injection 12/25/23 without significant relief    Medications:  Outpatient Encounter Medications as of 03/10/2024  Medication Sig   albuterol  (PROVENTIL  HFA;VENTOLIN  HFA) 108 (90 Base) MCG/ACT inhaler Inhale 2 puffs into the lungs every 6 (six) hours as needed for wheezing or shortness of breath.   atorvastatin  (LIPITOR) 80 MG tablet Take 1 tablet (80 mg total) by mouth daily.   baclofen  (LIORESAL ) 10 MG tablet Take 0.5-1 tablets (5-10 mg total) by mouth 3 (three) times daily as needed for muscle spasms.   DULoxetine  (CYMBALTA ) 30 MG capsule Take 1 capsule (30 mg total) once a day by mouth for 2 weeks, then take 1 capsule (30 mg) twice a day.   methocarbamol  (ROBAXIN ) 500 MG tablet Take 2 tablets (1,000 mg total) by mouth 4 (four) times daily.   nitroGLYCERIN  (NITROSTAT ) 0.4 MG SL tablet Place 1 tablet (0.4 mg total) under the tongue every 5 (five) minutes as needed for chest pain.   Facility-Administered Encounter Medications as of 03/10/2024  Medication   diclofenac  Sodium (VOLTAREN ) 1 % topical gel 2 g    Allergies: has no known  allergies.  Physical Examination: Vitals: BP 139/74   Ht 6' 4 (1.93 m)   Wt 205 lb (93 kg)   BMI 24.95 kg/m  GENERAL:  Javier Avila is a 68 y.o. male appearing their stated age, alert and oriented x 3, in no apparent distress.  SKIN: no rashes or lesions, skin clean, dry, intact MSK: Shoulder: Right shoulder without any gross deformity.  No tenderness to palpation over the bicipital groove or AC joint, mild tenderness palpation over the greater tuberosity.  Near full range of motion with positive painful arc.  Negative empty can, positive Hawkins, positive Neer, rotator cuff strength 5 -/5 throughout Left shoulder with full range of motion pain, weakness, instability Neurovascular intact distally  Radiology: RT SHOULDER XR 12/31/23 showing: - no acute abnormality, no arthritis   COMPLETE MSK ultrasound right shoulder Date: 01/28/2024 Indication: Right shoulder pain Findings: - Bicep: Normal-appearing biceps in the bicipital groove.  Small amount of increased anechoic fluid.  No signs of tearing.  Well-visualized in short and long axis views - Subscapularis: Hypoechoic change at the insertional footplate, small calcification noted near the insertional footplate.  Partial articular sided tearing noted.  No full-thickness tearing.  Seen in short and long axis views - AC joint with mild degenerative changes, no effusion - Small amount of increased fluid in the subacromial bursa with mild dynamic subacromial impingement - Supraspinatus with full-thickness intrasubstance tearing well-visualized in short and long axis views - Normal-appearing infraspinatus and teres minor - Normal-appearing posterior glenohumeral joint   Impression: - Partial  tearing of the subscapularis and supraspinatus - Mild AC joint osteoarthritis - Mild subacromial bursitis with associated subacromial impingement Images and interpretation completed by Rainell Cedar, DO   Assessment & Plan Right rotator cuff tear of  supraspinatus and subscapularis as seen on previous ultrasound, ongoing pain - Previous injections ineffective.  - Patient would like to proceed with MRI of the right shoulder for further evaluation, he would be interested in surgical intervention if indicated  - Continue home exercises as he is doing - Continue Voltaren  gel as needed - Discuss acupuncture as non-invasive option to help with his symptoms.  He will consider this - Schedule follow-up one week post-MRI to review results and discuss further treatment   Patient expressed understanding & agreement with above.  Encounter Diagnoses  Name Primary?   Nontraumatic tear of supraspinatus tendon, right Yes   Partial tear of right subscapularis tendon, subsequent encounter     Orders Placed This Encounter  Procedures   MR SHOULDER RIGHT WO CONTRAST     Contains text generated by Abridge.

## 2024-05-29 ENCOUNTER — Ambulatory Visit
Admission: RE | Admit: 2024-05-29 | Discharge: 2024-05-29 | Disposition: A | Source: Ambulatory Visit | Attending: Family Medicine | Admitting: Family Medicine

## 2024-05-29 DIAGNOSIS — M75101 Unspecified rotator cuff tear or rupture of right shoulder, not specified as traumatic: Secondary | ICD-10-CM

## 2024-05-29 DIAGNOSIS — S46811D Strain of other muscles, fascia and tendons at shoulder and upper arm level, right arm, subsequent encounter: Secondary | ICD-10-CM

## 2024-06-02 ENCOUNTER — Ambulatory Visit: Payer: Self-pay | Admitting: Family Medicine

## 2024-06-02 NOTE — Progress Notes (Signed)
 MRI reviewed.  Will send message to staff to schedule follow-up appointment with me to review results and discuss further treatment since he has not been seen since November 2025.

## 2024-06-04 ENCOUNTER — Other Ambulatory Visit: Payer: Self-pay

## 2024-06-04 ENCOUNTER — Ambulatory Visit: Admitting: Family Medicine

## 2024-06-04 ENCOUNTER — Encounter: Payer: Self-pay | Admitting: Family Medicine

## 2024-06-04 VITALS — BP 136/84 | Ht 76.0 in | Wt 205.0 lb

## 2024-06-04 DIAGNOSIS — M67911 Unspecified disorder of synovium and tendon, right shoulder: Secondary | ICD-10-CM

## 2024-06-04 DIAGNOSIS — M19011 Primary osteoarthritis, right shoulder: Secondary | ICD-10-CM

## 2024-06-04 MED ORDER — METHYLPREDNISOLONE ACETATE 40 MG/ML IJ SUSP
40.0000 mg | Freq: Once | INTRAMUSCULAR | Status: AC
  Administered 2024-06-04: 40 mg via INTRA_ARTICULAR

## 2024-06-04 NOTE — Progress Notes (Signed)
 DATE OF VISIT: 06/04/2024        Javier Avila DOB: 1956-04-21 MRN: [id number]  Discussed the use of AI scribe software for clinical note transcription with the patient, who gave verbal consent to proceed.  History of Present Illness Javier Avila is a 69 year old male with right rotator cuff tendinopathy  who presents for follow-up of persistent right shoulder pain.  Right Shoulder Pain: - Onset approximately 1 year ago without identifiable inciting event - Pain initially most severe at night, especially when lying on stomach with arms abducted and externally rotated - Currently, pain also provoked by activities such as removing a shirt, which places the shoulder in a similar position - Able to perform daily activities without significant limitation - Most bothersome symptom is sleep disruption - Symptoms have remained stable since last visit, with no improvement  Prior Treatments and Response: - Trialed ibuprofen , acetaminophen , topical menthol, and warm compresses without significant improvement - Warm compresses effective for lower back but not for shoulder pain - Prior subacromial corticosteroid injection provided no relief - Performs home physical therapy exercises before activities requiring increased shoulder use (lifting groceries, household tasks, playing video games) with minimal perceived benefit  Completed MRI 05/29/24 with findings noted below  Metabolic Status: - Prediabetes with most recent hemoglobin A1c of 6.1% 09/11/22    Medications:  Outpatient Encounter Medications as of 06/04/2024  Medication Sig   albuterol  (PROVENTIL  HFA;VENTOLIN  HFA) 108 (90 Base) MCG/ACT inhaler Inhale 2 puffs into the lungs every 6 (six) hours as needed for wheezing or shortness of breath.   atorvastatin  (LIPITOR) 80 MG tablet Take 1 tablet (80 mg total) by mouth daily.   baclofen  (LIORESAL ) 10 MG tablet Take 0.5-1 tablets (5-10 mg total) by mouth 3 (three) times daily as needed for muscle spasms.    DULoxetine  (CYMBALTA ) 30 MG capsule Take 1 capsule (30 mg total) once a day by mouth for 2 weeks, then take 1 capsule (30 mg) twice a day.   methocarbamol  (ROBAXIN ) 500 MG tablet Take 2 tablets (1,000 mg total) by mouth 4 (four) times daily.   nitroGLYCERIN  (NITROSTAT ) 0.4 MG SL tablet Place 1 tablet (0.4 mg total) under the tongue every 5 (five) minutes as needed for chest pain.   Facility-Administered Encounter Medications as of 06/04/2024  Medication   diclofenac  Sodium (VOLTAREN ) 1 % topical gel 2 g   [COMPLETED] methylPREDNISolone  acetate (DEPO-MEDROL ) injection 40 mg    Allergies: has no known allergies.  Physical Examination: Vitals: BP 136/84   Ht 6' 4 (1.93 m)   Wt 205 lb (93 kg)   BMI 24.95 kg/m  Physical Exam GENERAL:  Javier Avila is a 69 y.o. male appearing their stated age, alert and oriented x 3, in no apparent distress.  SKIN: no rashes or lesions, skin clean, dry, intact MUSCULOSKELETAL: Right shoulder with restricted external and internal rotation both passively and actively, otherwise good ROM with positive painful arch. Positive Hawkins-Kennedy test on right shoulder. Negative empty can test and Neer test on right shoulder. Negative O'Brien's test on right shoulder. No tenderness over right AC joint. Mild tenderness over bicipital groove and greater tuberosity.  Rotator cuff strength 5-/5 throughout NEURO: sensation intact to light touch VASC: pulses 2+ and symmetric radial artery bilaterally, no edema  Radiology: MRI RT shoulder 05/29/24 showing: IMPRESSION: 1. Moderate supraspinatus and subscapularis tendinopathy with mild infraspinatus tendinopathy. 2. Moderate tendinopathy of the intraarticular long head of the biceps. 3. Trace subacromial-subdeltoid bursitis. 4. Mild degenerative chondral thinning in  the glenohumeral joint with small degenerative subcortical cysts along the posterolateral humeral head. 5. Mild degenerative spurring of the acromioclavicular  joint.  Assessment & Plan Right rotator cuff tendinopathy Chronic right shoulder pain with restricted rotation, refractory to multiple treatments. MRI shows findings consistent with tendinopathy. May have early signs of frozen shoulder - discussed formal PT - he is not interested and prefers to continue with HEP.  Emphasized should do this on a daily basis - discussed other options such as u/s guided glenohumeral injection.  He is interested and this was completed today as noted below.   - Continue Voltaren  gel prn - follow-up 6-8 weeks to reassess,  if no better consider formal PT vs shockwave or referral to a surgeon.    Mild glenohumeral osteoarthritis, right shoulder MRI shows mild chondral thinning and degenerative changes. Arthritis is mild and not the primary pain source.  - discussed trial of u/s guided glenohumeral injection.  He is interested and this was completed today as noted below.   - Continue Voltaren  gel prn - follow-up 6-8 weeks to reassess,  if no better consider formal PT vs shockwave or referral to a surgeon.    PROCEDURE:  Risks & benefits of RT shoulder u/s guided GH joint injection reviewed. Consent obtained. Time-out completed. Patient prepped and draped in the normal fashion. Musculoskeletal ultrasound used to identify appropriate anatomy. RT GH joint well identified posteriorly, no signs of effusion. After identifying appropriate anatomy, patient positioned & area cleansed with chlorhexadine. Ethyl chloride spray used to anesthetize the skin. Solution of 3 mL 1% lidocaine injected under u/s guidance for local anesthesia using 22-gauge 1.5 inch needle. After ensuring adequate anesthesia a solution of 7 mL 1% lidocaine with 1 mL methylprednisolone  (Depo-medrol ) 40mg /mL injected into the RT glenohumeral joint using a 22-gauge 3.5-inch needle via posterior approach under ultrasound guidance. Needle well-visualized in the joint. Images saved. Patient tolerated procedure well  without any complications.  Area covered with adhesive bandage. Post-procedure care reviewed, all questions answered.    Patient expressed understanding & agreement with above.  Encounter Diagnoses  Name Primary?   Tendinopathy of right rotator cuff Yes   Glenohumeral arthritis, right     Orders Placed This Encounter  Procedures   US  LIMITED JOINT SPACE STRUCTURES UP RIGHT     Contains text generated by Abridge.

## 2024-06-04 NOTE — Patient Instructions (Addendum)
 Your shoulder pain is due to Rotator Cuff tendinopathy  Today you received an injection with a corticosteroid (aka: cortisone injection). This injection is usually done in response to pain and inflammation. There is some numbing medicine (Lidocaine) in the shot, so the injected area may be numb and feel really good for the next couple of hours. The numbing medicine usually wears off in 2-3 hours, and then your pain level may be back to where it was before the injection until the cortisone starts working.    The actually benefit from the steroid injection is usually noticed within 3-5 days, but may take up to 14 days. You may actually experience a small (as in 10%) INCREASE in pain in the first 24 hours---that is common.  Things to watch out for that you should contact us  or a health care provider urgently would include: 1. Unusual (as in more than 10%) increase in pain 2. New fever > 101.5 3. New swelling or redness of the injected area. 4. Streaking of red lines around the area injected.  Do not hesitate to call or reach out with any questions or concerns.  You should continue voltaren  gel as needed. You should continue your home exercises daily.  Follow-up in 6 weeks to re-evaluate
